# Patient Record
Sex: Female | Born: 1965 | Race: Asian | Hispanic: No | Marital: Married | Smoking: Never smoker
Health system: Southern US, Community
[De-identification: ages and names within clinical notes are randomized; demographics above are authoritative.]

## PROBLEM LIST (undated history)

## (undated) DIAGNOSIS — S92909A Unspecified fracture of unspecified foot, initial encounter for closed fracture: Secondary | ICD-10-CM

## (undated) DIAGNOSIS — E559 Vitamin D deficiency, unspecified: Secondary | ICD-10-CM

## (undated) HISTORY — DX: Vitamin D deficiency, unspecified: E55.9

## (undated) HISTORY — DX: Unspecified fracture of unspecified foot, initial encounter for closed fracture: S92.909A

---

## 1998-12-09 HISTORY — PX: ABDOMINAL HYSTERECTOMY: SHX81

## 1999-10-23 ENCOUNTER — Encounter: Payer: Self-pay | Admitting: *Deleted

## 1999-10-23 ENCOUNTER — Ambulatory Visit (HOSPITAL_COMMUNITY): Admission: RE | Admit: 1999-10-23 | Discharge: 1999-10-23 | Payer: Self-pay | Admitting: *Deleted

## 2005-07-16 ENCOUNTER — Other Ambulatory Visit: Admission: RE | Admit: 2005-07-16 | Discharge: 2005-07-16 | Payer: Self-pay | Admitting: Family Medicine

## 2008-08-31 ENCOUNTER — Ambulatory Visit: Payer: Self-pay | Admitting: Gynecology

## 2008-08-31 ENCOUNTER — Other Ambulatory Visit: Admission: RE | Admit: 2008-08-31 | Discharge: 2008-08-31 | Payer: Self-pay | Admitting: Gynecology

## 2008-08-31 ENCOUNTER — Encounter: Payer: Self-pay | Admitting: Gynecology

## 2008-09-30 ENCOUNTER — Ambulatory Visit: Payer: Self-pay | Admitting: Gynecology

## 2009-02-08 ENCOUNTER — Ambulatory Visit: Payer: Self-pay | Admitting: Vascular Surgery

## 2009-05-12 ENCOUNTER — Ambulatory Visit: Payer: Self-pay | Admitting: Vascular Surgery

## 2009-06-21 ENCOUNTER — Ambulatory Visit: Payer: Self-pay | Admitting: Vascular Surgery

## 2009-06-28 ENCOUNTER — Ambulatory Visit: Payer: Self-pay | Admitting: Vascular Surgery

## 2009-07-12 ENCOUNTER — Ambulatory Visit: Payer: Self-pay | Admitting: Vascular Surgery

## 2009-07-19 ENCOUNTER — Ambulatory Visit: Payer: Self-pay | Admitting: Vascular Surgery

## 2009-08-24 ENCOUNTER — Ambulatory Visit: Payer: Self-pay | Admitting: Gynecology

## 2009-09-01 ENCOUNTER — Ambulatory Visit: Payer: Self-pay | Admitting: Vascular Surgery

## 2009-10-05 ENCOUNTER — Encounter: Payer: Self-pay | Admitting: Gynecology

## 2009-10-05 ENCOUNTER — Ambulatory Visit: Payer: Self-pay | Admitting: Gynecology

## 2009-10-05 ENCOUNTER — Other Ambulatory Visit: Admission: RE | Admit: 2009-10-05 | Discharge: 2009-10-05 | Payer: Self-pay | Admitting: Gynecology

## 2011-03-13 ENCOUNTER — Other Ambulatory Visit: Payer: Self-pay | Admitting: Gynecology

## 2011-03-13 ENCOUNTER — Encounter (INDEPENDENT_AMBULATORY_CARE_PROVIDER_SITE_OTHER): Payer: BC Managed Care – PPO | Admitting: Gynecology

## 2011-03-13 ENCOUNTER — Other Ambulatory Visit (HOSPITAL_COMMUNITY)
Admission: RE | Admit: 2011-03-13 | Discharge: 2011-03-13 | Disposition: A | Discharge status: Home / Self Care | Payer: BC Managed Care – PPO | Source: Ambulatory Visit | Attending: Gynecology | Admitting: Gynecology

## 2011-03-13 DIAGNOSIS — Z833 Family history of diabetes mellitus: Secondary | ICD-10-CM

## 2011-03-13 DIAGNOSIS — Z13220 Encounter for screening for lipoid disorders: Secondary | ICD-10-CM

## 2011-03-13 DIAGNOSIS — R635 Abnormal weight gain: Secondary | ICD-10-CM

## 2011-03-13 DIAGNOSIS — Z01419 Encounter for gynecological examination (general) (routine) without abnormal findings: Secondary | ICD-10-CM

## 2011-03-13 DIAGNOSIS — Z124 Encounter for screening for malignant neoplasm of cervix: Secondary | ICD-10-CM | POA: Insufficient documentation

## 2011-03-14 ENCOUNTER — Other Ambulatory Visit (INDEPENDENT_AMBULATORY_CARE_PROVIDER_SITE_OTHER): Payer: BC Managed Care – PPO

## 2011-03-14 DIAGNOSIS — E039 Hypothyroidism, unspecified: Secondary | ICD-10-CM

## 2011-04-10 ENCOUNTER — Other Ambulatory Visit (INDEPENDENT_AMBULATORY_CARE_PROVIDER_SITE_OTHER): Payer: BC Managed Care – PPO

## 2011-04-10 ENCOUNTER — Encounter (INDEPENDENT_AMBULATORY_CARE_PROVIDER_SITE_OTHER): Payer: BC Managed Care – PPO | Admitting: Vascular Surgery

## 2011-04-10 DIAGNOSIS — I771 Stricture of artery: Secondary | ICD-10-CM

## 2011-04-10 DIAGNOSIS — I6529 Occlusion and stenosis of unspecified carotid artery: Secondary | ICD-10-CM

## 2011-04-11 NOTE — Assessment & Plan Note (Signed)
OFFICE VISIT  KATELAND, LEISINGER SUN DOB:  [birth year]                                       04/10/2011 ZOXWR#:[id number]  Patient presents today for evaluation of an abnormal screening carotid study.  She is well-known to me from prior treatment of her lower extremity venous varicosities.  She had undergone an outpatient screening and was found to have intimal thickening of her carotids.  She is seen today for further evaluation and formal left carotid duplex study.  PAST MEDICAL HISTORY:  Negative for any premature atherosclerosis.  FAMILY HISTORY:  Negative for premature atherosclerotic disease as well.  She denies prior amaurosis fugax or ischemic attack or stroke.  She is otherwise extremely healthy with no major medical difficulties..  PHYSICAL EXAMINATION:  A well-developed and well-nourished white female in no acute distress, appearing stated age.  Blood pressure is 110/72, pulse 53, respirations 18.  HEENT is normal.  Her carotid arteries are without bruits bilaterally.  She has normal carotid pulses.  She has 2+ radial pulses bilaterally.  She did have a carotid duplex in our office which I have ordered and independently reviewed.  This shows completely normal carotid arteries. There is no evidence of any atherosclerotic changes and wide patency.  I have reassured patient regarding this and would not recommend any further follow-up or evaluation.  She will see [place] again on an as-needed basis.    Larina Earthly, M.D. Electronically Signed  TFE/MEDQ  D:  04/10/2011  T:  04/11/2011  Job:  [id number]  cc:   Gaetano Hawthorne. Lily Peer, M.D.

## 2011-04-18 NOTE — Procedures (Unsigned)
CAROTID DUPLEX EXAM  INDICATION:  Abnormal carotid intimal thickness screen in 2011.  HISTORY: Diabetes:  No. Cardiac:  No. Hypertension:  No. Smoking:  No. Previous Surgery:  No. CV History:  Currently asymptomatic. Amaurosis Fugax No, Paresthesias No, Hemiparesis No.                                      RIGHT             LEFT Brachial systolic pressure:         102               100 Brachial Doppler waveforms:         Normal            Normal Vertebral direction of flow:        Antegrade         Antegrade DUPLEX VELOCITIES (cm/sec) CCA peak systolic                   94                80 ECA peak systolic                   57                44 ICA peak systolic                   64                63 ICA end diastolic                   19                25 PLAQUE MORPHOLOGY: PLAQUE AMOUNT:                      None              None PLAQUE LOCATION:  IMPRESSION:  No evidence of bilateral carotid artery disease noted.  ___________________________________________ Di Kindle. Edilia Bo, M.D.  CH/MEDQ  D:  04/11/2011  T:  04/11/2011  Job:  [id number]

## 2011-04-23 NOTE — Procedures (Signed)
LOWER EXTREMITY VENOUS REFLUX EXAM   INDICATION:  Bilateral leg varicose veins with pain and swelling.   EXAM:  Using color-flow imaging and pulse Doppler spectral analysis, the  right and left common femoral, superficial femoral, popliteal, posterior  tibial, greater and lesser saphenous veins are evaluated.  There is  evidence suggesting deep venous insufficiency in the right and left  lower extremity.   The right and left saphenofemoral junctions are not competent.  The  right and left GSV's are not competent with the caliber as described  below.   The right and left proximal short saphenous vein demonstrate competency.   GSV Diameter (used if found to be incompetent only)                                            Right    Left  Proximal Greater Saphenous Vein           0.67 cm  0.59 cm  Proximal-to-mid-thigh                     0.67 cm  0.59 cm  Mid thigh                                 0.67 cm  0.63 cm  Mid-distal thigh                          0.67 cm  0.63 cm  Distal thigh                              0.60 cm  0.65 cm  Knee                                      1.08 cm  0.59 cm   IMPRESSION:  1. Right and left greater saphenous vein reflux is identified with the      caliber ranging from on the right 1.08 cm to 1.14 cm knee to groin      and on the left 0.59 to 1.07.  2. The right and left greater saphenous vein is not aneurysmal.  3. The right and left greater saphenous vein is not tortuous.  4. The deep venous system is not competent.  5. The right and left lesser saphenous veins are competent.  6. No evidence of deep venous thrombosis noted in bilateral legs.   ___________________________________________  Larina Earthly, M.D.   MG/MEDQ  D:  02/08/2009  T:  02/08/2009  Job:  [id number]

## 2011-04-23 NOTE — Procedures (Signed)
DUPLEX DEEP VENOUS EXAM - LOWER EXTREMITY   INDICATION:  Followup left greater saphenous ablation.   HISTORY:  Edema:  No.  Trauma/Surgery:  Yes.  Pain:  Yes.  PE:  No.  Previous DVT:  No.  Anticoagulants:  Other:   DUPLEX EXAM:                CFV   SFV   PopV  PTV    GSV                R  L  R  L  R  L  R   L  R  L  Thrombosis    0  0     0     0      0     0  Spontaneous   +  +  +  +  +  +  +   +  +  0  Phasic        +  +  +  +  +  +  +   +  +  0  Augmentation  +  +  +  +  +  +  +   +     0  Compressible  +  +  +  +  +  +  +   +  +  0  Competent     +  +  +  +  +  +  +   +     0   Legend:  + - yes  o - no  p - partial  D - decreased   IMPRESSION:  1. No evidence of DVT noted in the left leg.  2. The left greater saphenous vein appears ablated from saphenofemoral      junction to the knee level.    _____________________________  Larina Earthly, M.D.   MG/MEDQ  D:  07/19/2009  T:  07/19/2009  Job:  [id number]

## 2011-04-23 NOTE — Assessment & Plan Note (Signed)
OFFICE VISIT   [name] [name], [name] [name] SUN  DOB:  [birth year]                                       09/01/2009  EAVWU#:[id number]   The patient presents today for followup of her staged bilateral laser  ablation of great saphenous vein and stab phlebectomies of multiple  tributary varicosities.  She has done quite well.  She reports no  discomfort at all.  In her right leg she does continue to have some dull  aching sensation in her left leg which is not limiting to her.  She has  well-healed stab phlebectomies bilaterally and no evidence of  postoperative complications.  She will continue her usual activities  without limitation and we will see her again on an as-needed basis.   Larina Earthly, M.D.  Electronically Signed   TFE/MEDQ  D:  09/01/2009  T:  09/02/2009  Job:  [id number]

## 2011-04-23 NOTE — Consult Note (Signed)
NEW PATIENT CONSULTATION   [name] [name] SUN  DOB:  1966-12-03                                       02/08/2009  ZOXWR#:[id number]   The patient presents today for evaluation of bilateral venous pathology.  She is a very pleasant 45 year old female with progressive changes of  aching discomfort in both lower extremities.  This has been progressive  for many years and is slightly worse on her right leg than her left leg.  She reports that this interferes with her daily activities.  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  She  reports that this is progressing.  She does have significant tributary  varicosities in her right calf which are more scattered over her left  calf.  She does report swelling with prolonged standing.  She does not  have any history of deep venous thrombosis or hemorrhage.  She has  elevated her legs when possible and does take ibuprofen 600 mg p.o.  t.i.d. for the discomfort, she has not worn compression garments.  She  does have a strong family history in her mother and sisters regarding  venous pathology.  Two sisters have had saphenous vein treatment for  reflux. Past medical histories are otherwise unremarkable.  She has no  known drug allergies.  She does not have any major cardiac or pulmonary  difficulties.  She does not smoke, drink alcohol.  Review of systems  otherwise completely negative except for discomfort in her lower  extremities.   CURRENT MEDICATIONS:  Estradiol 0.5 mg daily, apply to her arms.   PHYSICAL EXAMINATION:  The patient is a well-developed well-nourished  female appearing stated age 67.  She is grossly intact neurologically.  Her radial pulses are 2+.  She has 2+ dorsalis pedis pulses bilaterally.  Her lower extremities are noted for mild swelling in her ankles  bilaterally.  She does have significant tributary varicosities in her  right posterior calf and a slight amount of  varicosities in her left  posterior calf.  She underwent venous duplex today and this reveals  reflux throughout an enlarged great saphenous vein bilaterally.  This is  from the saphenofemoral junction distally.  She does have a maximal  diameter ranging from 0.6 to 1 cm throughout the course bilaterally.  She does have some reflux in her deep system as well.  I discussed  options with the patient.  She clearly has severe symptoms related to  her venous hypertension.  We have recommended compression garments as  her an initial attempt at treatment..  We fitted her with thigh-high 20  mmHg to 30 mmHg compression garments..  I did discuss options to include  laser ablation and stab phlebectomy for relief of her venous  hypertension should this conservative treatment fail.  We will see her  again in 3 months for further monitoring.   Larina Earthly, M.D.  Electronically Signed   TFE/MEDQ  D:  02/08/2009  T:  02/09/2009  Job:  [id number]   cc:   C. Duane Lope, M.D.

## 2011-04-23 NOTE — Assessment & Plan Note (Signed)
OFFICE VISIT   [name] [name], THREATS SUN  DOB:  07-17-1966                                       06/21/2009  IEPPI#:[id number]   The patient presents today for right leg laser ablation and stab  phlebectomy for venous hypertension.  She did quite well with the  procedure with normal discomfort and no immediate complications.  She  will see me in 1 week with duplex followup of the treated right leg and  also at that point will schedule left leg ablation.   Larina Earthly, M.D.  Electronically Signed   TFE/MEDQ  D:  06/21/2009  T:  06/22/2009  Job:  [id number]

## 2011-04-23 NOTE — Assessment & Plan Note (Signed)
OFFICE VISIT   [name] [name], [name] [name]  DOB:  21-Mar-1966                                       05/12/2009  ZOXWR#:[id number]   Patient presents today for continued discussion regarding her bilateral  venous hypertension.  I have seen her initially in March, 2010.  She is  a very pleasant 45 year old female with progressive changes of venous  hypertension bilaterally.  She has a great deal of discomfort and pain  with prolonged standing and sitting.  This negatively impacts her job.  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  She also  reports that this causes pain with house work, cooking, yard work, and  her daily activities.  She has been compliant with her compression  garments and reports these have given her no improvement in pain.  She  continues to elevate her legs when possible.   She had undergone a noninvasive vascular laboratory study in our office  the last visit.  This revealed gross reflux at her high-grade saphenous  veins bilaterally.  She does have tributary varicosities, more so on her  right calf than her left calf.   I discussed options with patient.  I told her she is an excellent  candidate for laser ablation of her great saphenous vein for reduction  of her venous hypertension.  I also recommend to have phlebectomy at the  same time or improvement in her pain and reflux.  I explained this is an  outpatient procedure in our office.  I explained that this would be kind  of a staged fashion.  She feels that the right leg is the most  symptomatic, and therefore we would proceed with a right great saphenous  vein laser ablation and stab phlebectomy followed by left leg.  We will  proceed at her convenience.   Larina Earthly, M.D.  Electronically Signed   TFE/MEDQ  D:  05/12/2009  T:  05/15/2009  Job:  [id number]   cc:   C. Duane Lope, M.D.

## 2011-04-23 NOTE — Assessment & Plan Note (Signed)
OFFICE VISIT   [name] [name], [name] [name] SUN  DOB:  July 19, 1966                                       07/12/2009  ZOXWR#:[id number]   Patient presents today for laser ablation and stab phlebectomy of her  left great saphenous vein and tributary varicosities.   This was done in our office under local anesthesia with no immediate  complications.  She did quite well with the procedure and will see me  again in 1 week.   Larina Earthly, M.D.  Electronically Signed   TFE/MEDQ  D:  07/12/2009  T:  07/12/2009  Job:  [id number]

## 2011-04-23 NOTE — Assessment & Plan Note (Signed)
OFFICE VISIT   [name] [name], [name] [name] SUN  DOB:  26-Dec-1965                                       06/28/2009  ZOXWR#:[id number]   The patient presents today 1 week followup laser ablation of her right  great saphenous vein and stab phlebectomy of multiple tributary  varicosities.  She has done quite well following the procedure and has  the usual amount of mild discomfort in her thigh and calf.  She has been  compliant with her compression garments.   She underwent repeat duplex of her right leg today and this shows  excellent result with thrombosis of her great saphenous vein throughout  the thigh and no evidence of deep venous thrombosis.  She is quite  pleased with her initial result as am I and wishes to proceed with her  staged left leg ablation.  We have scheduled this with her on August 4.   Larina Earthly, M.D.  Electronically Signed   TFE/MEDQ  D:  06/28/2009  T:  06/29/2009  Job:  [id number]   cc:   C. Duane Lope, M.D.

## 2011-04-23 NOTE — Assessment & Plan Note (Signed)
OFFICE VISIT   DELAYLA, HOFFMASTER SUN  DOB:  1966-03-10                                       07/19/2009  ZOXWR#:[id number]   The patient presents today for followup of her left laser ablation on  08/04, she had had a right leg ablation on 06/21/2009.  She has done  quite well with the procedure and has the usual amount of postoperative  discomfort.  She has mild bruising.   She underwent repeat duplex today and this shows no evidence of deep  venous thrombosis in the left leg.  She does have ablation of her great  saphenous vein from her knee to the saphenofemoral junction.  I am quite  pleased with the patient's result as is the patient.  I plan to see her  again in 6 weeks for final followup.  She will wear graduated  compression garments for 1 additional week.   Larina Earthly, M.D.  Electronically Signed   TFE/MEDQ  D:  07/19/2009  T:  07/20/2009  Job:  [id number]   cc:   C. Duane Lope, M.D.

## 2011-04-23 NOTE — Procedures (Signed)
DUPLEX DEEP VENOUS EXAM - LOWER EXTREMITY   INDICATION:  Followup evaluation status post EVLT.   HISTORY:  Edema:  No.  Trauma/Surgery:  Right greater saphenous vein EVLT and stab phlebectomy  06/21/2009.  Pain:  Right leg pain.  PE:  No.  Previous DVT:  No.  Anticoagulants:  No.  Other:   DUPLEX EXAM:                CFV   SFV   PopV  PTV    GSV                R  L  R  L  R  L  R   L  R  L  Thrombosis    0  0  0     0     0      +  Spontaneous   +  +  +     +     +      0  Phasic        +  +  +     +     +      0  Augmentation  +  +  +     +     +      0  Compressible  +  +  +     +     +      0  Competent     0  0  0     0     +      +   Legend:  + - yes  o - no  p - partial  D - decreased   IMPRESSION:  1. 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  2. An anterolateral branch of the right greater saphenous vein graft      is patent and competent.  3. No evidence of right leg DVT.  4. Right leg demonstrates deep vein incompetence from the right common      femoral vein through the superficial femoral vein and popliteal      vein.    _____________________________  Larina Earthly, M.D.   MC/MEDQ  D:  06/28/2009  T:  06/28/2009  Job:  [id number]

## 2011-10-25 ENCOUNTER — Encounter: Payer: Self-pay | Admitting: Gynecology

## 2012-03-24 ENCOUNTER — Other Ambulatory Visit: Payer: Self-pay | Admitting: Vascular Surgery

## 2012-06-18 ENCOUNTER — Ambulatory Visit (INDEPENDENT_AMBULATORY_CARE_PROVIDER_SITE_OTHER): Payer: BC Managed Care – PPO | Admitting: Gynecology

## 2012-06-18 ENCOUNTER — Encounter: Payer: Self-pay | Admitting: Gynecology

## 2012-06-18 VITALS — BP 112/70 | Ht 62.0 in | Wt 156.0 lb

## 2012-06-18 DIAGNOSIS — E7800 Pure hypercholesterolemia, unspecified: Secondary | ICD-10-CM

## 2012-06-18 DIAGNOSIS — L819 Disorder of pigmentation, unspecified: Secondary | ICD-10-CM

## 2012-06-18 DIAGNOSIS — R319 Hematuria, unspecified: Secondary | ICD-10-CM

## 2012-06-18 DIAGNOSIS — R635 Abnormal weight gain: Secondary | ICD-10-CM | POA: Insufficient documentation

## 2012-06-18 DIAGNOSIS — R946 Abnormal results of thyroid function studies: Secondary | ICD-10-CM

## 2012-06-18 DIAGNOSIS — Z01419 Encounter for gynecological examination (general) (routine) without abnormal findings: Secondary | ICD-10-CM

## 2012-06-18 DIAGNOSIS — Z780 Asymptomatic menopausal state: Secondary | ICD-10-CM | POA: Insufficient documentation

## 2012-06-18 DIAGNOSIS — R7989 Other specified abnormal findings of blood chemistry: Secondary | ICD-10-CM

## 2012-06-18 DIAGNOSIS — L811 Chloasma: Secondary | ICD-10-CM | POA: Insufficient documentation

## 2012-06-18 LAB — CBC WITH DIFFERENTIAL/PLATELET
Basophils Absolute: 0 10*3/uL (ref 0.0–0.1)
Basophils Relative: 0 % (ref 0–1)
Eosinophils Absolute: 0.5 10*3/uL (ref 0.0–0.7)
Eosinophils Relative: 7 % — ABNORMAL HIGH (ref 0–5)
HCT: 37.2 % (ref 36.0–46.0)
Hemoglobin: 12.3 g/dL (ref 12.0–15.0)
Lymphocytes Relative: 33 % (ref 12–46)
Lymphs Abs: 2.4 10*3/uL (ref 0.7–4.0)
MCH: 29.4 pg (ref 26.0–34.0)
MCHC: 33.1 g/dL (ref 30.0–36.0)
MCV: 89 fL (ref 78.0–100.0)
Monocytes Absolute: 0.3 10*3/uL (ref 0.1–1.0)
Monocytes Relative: 4 % (ref 3–12)
Neutro Abs: 4.1 10*3/uL (ref 1.7–7.7)
Neutrophils Relative %: 56 % (ref 43–77)
Platelets: 253 10*3/uL (ref 150–400)
RBC: 4.18 MIL/uL (ref 3.87–5.11)
RDW: 13.9 % (ref 11.5–15.5)
WBC: 7.3 10*3/uL (ref 4.0–10.5)

## 2012-06-18 LAB — HEMOGLOBIN A1C
Hgb A1c MFr Bld: 5.6 % (ref <5.7)
Mean Plasma Glucose: 114 mg/dL (ref <117)

## 2012-06-18 LAB — TSH: TSH: 0.295 u[IU]/mL — ABNORMAL LOW (ref 0.350–4.500)

## 2012-06-18 LAB — CHOLESTEROL, TOTAL: Cholesterol: 211 mg/dL — ABNORMAL HIGH (ref 0–200)

## 2012-06-18 NOTE — Patient Instructions (Signed)
Health Maintenance, 18- to 48-Year-Old SCHOOL PERFORMANCE After high school completion, the young adult may be attending college, technical or vocational school, or entering the military or the work force. SOCIAL AND EMOTIONAL DEVELOPMENT The young adult establishes adult relationships and explores sexual identity. Young adults may be living at home or in a college dorm or apartment. Increasing independence is important with young adults. Throughout adolescence, teens should assume responsibility of their own health care. IMMUNIZATIONS Most young adults should be fully vaccinated. A booster dose of Tdap (tetanus, diphtheria, and pertussis, or "whooping cough"), a dose of meningococcal vaccine to protect against a certain type of bacterial meningitis, hepatitis A, human papillomarvirus (HPV), chickenpox, or measles vaccines may be indicated, if not given at an earlier age. Annual influenza or "flu" vaccination should be considered during flu season.  TESTING Annual screening for vision and hearing problems is recommended. Vision should be screened objectively at least once between 18 and 48 years of age. The young adult may be screened for anemia or tuberculosis. Young adults should have a blood test to check for high cholesterol during this time period. Young adults should be screened for use of alcohol and drugs. If the young adult is sexually active, screening for sexually transmitted infections, pregnancy, or HIV may be performed. Screening for cervical cancer should be performed within 3 years of beginning sexual activity. NUTRITION AND ORAL HEALTH  Adequate calcium intake is important. Consume 3 servings of low-fat milk and dairy products daily. For those who do not drink milk or consume dairy products, calcium enriched foods, such as juice, bread, or cereal, dark, leafy greens, or canned fish are alternate sources of calcium.   Drink plenty of water. Limit fruit juice to 8 to 12 ounces per day.  Avoid sugary beverages or sodas.   Discourage skipping meals, especially breakfast. Teens should eat a good variety of vegetables and fruits, as well as lean meats.   Avoid high fat, high salt, and high sugar foods, such as candy, chips, and cookies.   Encourage young adults to participate in meal planning and preparation.   Eat meals together as a family whenever possible. Encourage conversation at mealtime.   Limit fast food choices and eating out at restaurants.   Brush teeth twice a day and floss.   Schedule dental exams twice a year.  SLEEP Regular sleep habits are important. PHYSICAL, SOCIAL, AND EMOTIONAL DEVELOPMENT  One hour of regular physical activity daily is recommended. Continue to participate in sports.   Encourage young adults to develop their own interests and consider community service or volunteerism.   Provide guidance to the young adult in making decisions about college and work plans.   Make sure that young adults know that they should never be in a situation that makes them uncomfortable, and they should tell partners if they do not want to engage in sexual activity.   Talk to the young adult about body image. Eating disorders may be noted at this time. Young adults may also be concerned about being overweight. Monitor the young adult for weight gain or loss.   Mood disturbances, depression, anxiety, alcoholism, or attention problems may be noted in young adults. Talk to the caregiver if there are concerns about mental illness.   Negotiate limit setting and independent decision making.   Encourage the young adult to handle conflict without physical violence.   Avoid loud noises which may impair hearing.   Limit television and computer time to 2 hours per day.   Individuals who engage in excessive sedentary activity are more likely to become overweight.  RISK BEHAVIORS  Sexually active young adults need to take precautions against pregnancy and sexually  transmitted infections. Talk to young adults about contraception.   Provide a tobacco-free and drug-free environment for the young adult. Talk to the young adult about drug, tobacco, and alcohol use among friends or at friends' homes. Make sure the young adult knows that smoking tobacco or marijuana and taking drugs have health consequences and may impact brain development.   Teach the young adult about appropriate use of over-the-counter or prescription medicines.   Establish guidelines for driving and for riding with friends.   Talk to young adults about the risks of drinking and driving or boating. Encourage the young adult to call you if he or she or friends have been drinking or using drugs.   Remind young adults to wear seat belts at all times in cars and life vests in boats.   Young adults should always wear a properly fitted helmet when they are riding a bicycle.   Use caution with all-terrain vehicles (ATVs) or other motorized vehicles.   Do not keep handguns in the home. (If you do, the gun and ammunition should be locked separately and out of the young adult's access.)   Equip your home with smoke detectors and change the batteries regularly. Make sure all family members know the fire escape plans for your home.   Teach young adults not to swim alone and not to dive in shallow water.   All individuals should wear sunscreen that protects against UVA and UVB light with at least a sun protection factor (SPF) of 30 when out in the sun. This minimizes sun burning.  WHAT'S NEXT? Young adults should visit their pediatrician or family physician yearly. By young adulthood, health care should be transitioned to a family physician or internal medicine specialist. Sexually active females may want to begin annual physical exams with a gynecologist. Document Released: 02/20/2007 Document Revised: 11/14/2011 Document Reviewed: 03/12/2007 [employer] Patient Information 2012 [employer]. 

## 2012-06-18 NOTE — Progress Notes (Signed)
[name] [name] 20-Jul-1966 [id number]   History:    46 y.o.  for annual gyn exam with complaint of skin color changes as a result of her hormones. Patient has a history of a total abdominal hysterectomy with bilateral salpingo-oophorectomy in the year 2000 as a result of endometriosis. She had been on hormone replacement therapy since that time. She has been on numerous regimens from oral to transdermal. Review of her record indicated her last bone density study was in 2009. She also been concerned about weight gain. Her last mammogram was in 2012 Past medical history,surgical history, family history and social history were all reviewed and documented in the EPIC chart.  Gynecologic History No LMP recorded. Patient has had a hysterectomy. Contraception: Prior hysterectomy Last Pap: 2012. Results were: normal Last mammogram: 2012. Results were: normal  Obstetric History OB History    Grav Para Term Preterm Abortions TAB SAB Ect Mult Living   1 1 1       1      # Outc Date GA Lbr Len/2nd Wgt Sex Del Anes PTL Lv   1 TRM     F CS  No Yes       ROS: A ROS was performed and pertinent positives and negatives are included in the history.  GENERAL: No fevers or chills. HEENT: No change in vision, no earache, sore throat or sinus congestion. NECK: No pain or stiffness. CARDIOVASCULAR: No chest pain or pressure. No palpitations. PULMONARY: No shortness of breath, cough or wheeze. GASTROINTESTINAL: No abdominal pain, nausea, vomiting or diarrhea, melena or bright red blood per rectum. GENITOURINARY: No urinary frequency, urgency, hesitancy or dysuria. MUSCULOSKELETAL: No joint or muscle pain, no back pain, no recent trauma. DERMATOLOGIC: No rash, no itching, no lesions. ENDOCRINE: No polyuria, polydipsia, no heat or cold intolerance. No recent change in weight. HEMATOLOGICAL: No anemia or easy bruising or bleeding. NEUROLOGIC: No headache, seizures, numbness, tingling or weakness. PSYCHIATRIC: No depression,  no loss of interest in normal activity or change in sleep pattern.     Exam: chaperone present  BP 112/70  Ht 5\' 2"  (1.575 m)  Wt 156 lb (70.761 kg)  BMI 28.53 kg/m2  Body mass index is 28.53 kg/(m^2).  General appearance : Well developed well nourished female. No acute distress HEENT: Neck supple, trachea midline, no carotid bruits, no thyroidmegaly Lungs: Clear to auscultation, no rhonchi or wheezes, or rib retractions  Heart: Regular rate and rhythm, no murmurs or gallops Breast:Examined in sitting and supine position were symmetrical in appearance, no palpable masses or tenderness,  no skin retraction, no nipple inversion, no nipple discharge, no skin discoloration, no axillary or supraclavicular lymphadenopathy Abdomen: no palpable masses or tenderness, no rebound or guarding Extremities: no edema or skin discoloration or tenderness  Pelvic:  Bartholin, Urethra, Skene Glands: Within normal limits             Vagina: No gross lesions or discharge  Cervix: Absent Uterus absent  Adnexa  Without masses or tenderness  Anus and perineum  normal   Rectovaginal  normal sphincter tone without palpated masses or tenderness             Hemoccult not done     Assessment/Plan:  46 y.o. female for annual exam who wanted to change her hormone treatment. We had a lengthy discussion of the women's health initiative study. She has now been on hormone replacement therapy for almost 13 years. I would like to offer her an alternative with a  nonhormonal product:Brisdelle 7.5 mg daily (Paxil) SSRI. I've encouraged also to exercise regularly as well as to add to her diet slowly-containing products. The following labs will be drawn today: CBC, TSH, hemoglobin A1c, along with a urinalysis and screening cholesterol. No Pap smear done today. New Pap smear screening guidelines discussed. She was reminded to perform her monthly self breast examination. We will monitor her symptoms. All questions are answered  and we'll follow accordingly. She will schedule her bone density study in the next couple weeks here in the office.    Ok Edwards MD, 2:06 PM 06/18/2012

## 2012-06-19 ENCOUNTER — Encounter: Payer: BC Managed Care – PPO | Admitting: Gynecology

## 2012-06-19 LAB — URINALYSIS W MICROSCOPIC + REFLEX CULTURE
Bacteria, UA: NONE SEEN
Bilirubin Urine: NEGATIVE
Casts: NONE SEEN
Crystals: NONE SEEN
Glucose, UA: NEGATIVE mg/dL
Ketones, ur: NEGATIVE mg/dL
Nitrite: NEGATIVE
Specific Gravity, Urine: <1.005 — ABNORMAL LOW (ref 1.005–1.030)
pH: 7 (ref 5.0–8.0)

## 2012-06-19 NOTE — Addendum Note (Signed)
Addended by: Venora Maples on: 06/19/2012 11:06 AM   Modules accepted: Orders

## 2012-06-24 ENCOUNTER — Other Ambulatory Visit: Payer: BC Managed Care – PPO

## 2012-06-24 DIAGNOSIS — R7989 Other specified abnormal findings of blood chemistry: Secondary | ICD-10-CM

## 2012-06-24 DIAGNOSIS — Z13820 Encounter for screening for osteoporosis: Secondary | ICD-10-CM

## 2012-06-24 DIAGNOSIS — E7800 Pure hypercholesterolemia, unspecified: Secondary | ICD-10-CM

## 2012-06-24 DIAGNOSIS — Z780 Asymptomatic menopausal state: Secondary | ICD-10-CM

## 2012-06-24 LAB — LIPID PANEL: Cholesterol: 216 mg/dL — ABNORMAL HIGH (ref 0–200)

## 2012-06-26 ENCOUNTER — Other Ambulatory Visit: Payer: Self-pay | Admitting: Gynecology

## 2012-06-26 DIAGNOSIS — E7800 Pure hypercholesterolemia, unspecified: Secondary | ICD-10-CM

## 2012-07-14 ENCOUNTER — Ambulatory Visit (INDEPENDENT_AMBULATORY_CARE_PROVIDER_SITE_OTHER): Payer: BC Managed Care – PPO

## 2012-07-14 DIAGNOSIS — Z13820 Encounter for screening for osteoporosis: Secondary | ICD-10-CM

## 2012-07-22 ENCOUNTER — Encounter: Payer: Self-pay | Admitting: Gynecology

## 2012-09-07 ENCOUNTER — Telehealth: Payer: Self-pay | Admitting: *Deleted

## 2012-09-07 NOTE — Telephone Encounter (Signed)
You can call her generic Paxil 10 mg and have her take half a tablet daily. Prescribe 30 refill x5.

## 2012-09-07 NOTE — Telephone Encounter (Signed)
Pharmacist Debbie called and states that the Rx you wrote for the patient in July that she is just now taking to them is not available yet. Brisdelle (paxil) 7.5mg . Do you want to give Paxil 10mg ? Or Pls advise. KW

## 2012-09-08 MED ORDER — PAROXETINE HCL 10 MG PO TABS: 10.0000 mg | ORAL_TABLET | ORAL | Status: DC

## 2012-09-08 NOTE — Telephone Encounter (Signed)
Informed pharmacy KW

## 2012-09-08 NOTE — Addendum Note (Signed)
Addended by: Richardson Chiquito on: 09/08/2012 10:58 AM   Modules accepted: Orders

## 2013-02-10 ENCOUNTER — Encounter: Payer: Self-pay | Admitting: Gynecology

## 2013-06-21 ENCOUNTER — Ambulatory Visit (INDEPENDENT_AMBULATORY_CARE_PROVIDER_SITE_OTHER): Payer: BC Managed Care – PPO | Admitting: Gynecology

## 2013-06-21 ENCOUNTER — Encounter: Payer: Self-pay | Admitting: Gynecology

## 2013-06-21 VITALS — BP 110/72 | Ht 62.25 in | Wt 144.5 lb

## 2013-06-21 DIAGNOSIS — Z23 Encounter for immunization: Secondary | ICD-10-CM

## 2013-06-21 DIAGNOSIS — Z1159 Encounter for screening for other viral diseases: Secondary | ICD-10-CM

## 2013-06-21 DIAGNOSIS — R634 Abnormal weight loss: Secondary | ICD-10-CM

## 2013-06-21 DIAGNOSIS — N951 Menopausal and female climacteric states: Secondary | ICD-10-CM

## 2013-06-21 DIAGNOSIS — N952 Postmenopausal atrophic vaginitis: Secondary | ICD-10-CM

## 2013-06-21 DIAGNOSIS — Z01419 Encounter for gynecological examination (general) (routine) without abnormal findings: Secondary | ICD-10-CM

## 2013-06-21 DIAGNOSIS — Z780 Asymptomatic menopausal state: Secondary | ICD-10-CM

## 2013-06-21 MED ORDER — MEDROXYPROGESTERONE ACETATE 150 MG/ML IM SUSP: 150.0000 mg | Freq: Once | INTRAMUSCULAR | Status: DC

## 2013-06-21 MED ORDER — ESTRADIOL 10 MCG VA TABS: 1.0000 | ORAL_TABLET | VAGINAL | Status: DC

## 2013-06-21 NOTE — Addendum Note (Signed)
Addended by: Bertram Savin A on: 06/21/2013 05:00 PM   Modules accepted: Orders

## 2013-06-21 NOTE — Progress Notes (Signed)
[name] [name] [birth year] [id number]   History:    47 y.o.  for annual gyn exam complaining of mild vasomotor symptoms. Patient has been tried in the past on multiple sutures of oral and transdermal estrogen. Patient is currently taking soy products and exercising which has curtailed her symptoms. She does have vaginal atrophy and decreased libido at times. Patient with past history of total abdominal hysterectomy with bilateral salpingo-oophorectomy in the year 2000 as a result of severe endometriosis. She had been on hormone replacement therapy until 2013. Patient had a normal bone density study in 2009 and 2013. She is taking calcium and vitamin D and exercises regularly.  Past medical history,surgical history, family history and social history were all reviewed and documented in the EPIC chart.  Gynecologic History No LMP recorded. Patient has had a hysterectomy. Contraception: status post hysterectomy Last Pap: 2012. Results were: normal Last mammogram: 2014. Results were: normal  Obstetric History OB History   Grav Para Term Preterm Abortions TAB SAB Ect Mult Living   1 1 1       1      # Outc Date GA Lbr Len/2nd Wgt Sex Del Anes PTL Lv   1 TRM     F CS  No Yes       ROS: A ROS was performed and pertinent positives and negatives are included in the history.  GENERAL: No fevers or chills. HEENT: No change in vision, no earache, sore throat or sinus congestion. NECK: No pain or stiffness. CARDIOVASCULAR: No chest pain or pressure. No palpitations. PULMONARY: No shortness of breath, cough or wheeze. GASTROINTESTINAL: No abdominal pain, nausea, vomiting or diarrhea, melena or bright red blood per rectum. GENITOURINARY: No urinary frequency, urgency, hesitancy or dysuria. MUSCULOSKELETAL: No joint or muscle pain, no back pain, no recent trauma. DERMATOLOGIC: No rash, no itching, no lesions. ENDOCRINE: No polyuria, polydipsia, no heat or cold intolerance. No recent change in weight.  HEMATOLOGICAL: No anemia or easy bruising or bleeding. NEUROLOGIC: No headache, seizures, numbness, tingling or weakness. PSYCHIATRIC: No depression, no loss of interest in normal activity or change in sleep pattern.     Exam: chaperone present  BP 110/72  Ht 5' 2.25" (1.581 m)  Wt 144 lb 8 oz (65.545 kg)  BMI 26.22 kg/m2  Body mass index is 26.22 kg/(m^2).  General appearance : Well developed well nourished female. No acute distress HEENT: Neck supple, trachea midline, no carotid bruits, no thyroidmegaly Lungs: Clear to auscultation, no rhonchi or wheezes, or rib retractions  Heart: Regular rate and rhythm, no murmurs or gallops Breast:Examined in sitting and supine position were symmetrical in appearance, no palpable masses or tenderness,  no skin retraction, no nipple inversion, no nipple discharge, no skin discoloration, no axillary or supraclavicular lymphadenopathy Abdomen: no palpable masses or tenderness, no rebound or guarding Extremities: no edema or skin discoloration or tenderness  Pelvic:  Bartholin, Urethra, Skene Glands: Within normal limits             Vagina: No gross lesions or discharge  Cervix: absent  Uterus Absent  Adnexa  Without masses or tenderness  Anus and perineum  normal   Rectovaginal  normal sphincter tone without palpated masses or tenderness             Hemoccult None indicated     Assessment/Plan:  47 y.o. female for annual exam  Who in the year 2000 had TAH/BSO as a result of severe endometriosis. Patient has been off hormone replacement therapy  for over a year and a suffering from vaginal atrophy and dyspareunia and slightly decreased sex drive and some vasomotor symptoms. She will be placed on Vagifem 10 mcg intravaginally twice a week and we'll monitor her symptoms. The risks benefits pros and cons were discussed as well as the women's health initiative study. She'll return back in the fasting state to get her blood work which will consist of the  following: Fasting lipid profile, TSH, comprehensive metabolic panel, as well as urinalysis and CBC. No Pap smear done today new guidelines were discussed.  New CDC guidelines is recommending patients be tested once in her lifetime for hepatitis C antibody who were born between [birth year] through 1967. This was discussed with the patient today and has agreed to be tested today.She will need a bone density study next year. She has lost 12 pounds his last year since she has been exercising and eating healthier.  Patient was counseled and received the Tdap vaccine today.    Ok Edwards MD, 4:48 PM 06/21/2013

## 2013-06-21 NOTE — Patient Instructions (Addendum)
Breast Self-Awareness Practicing breast self-awareness may pick up problems early, prevent significant medical complications, and possibly save your life. By practicing breast self-awareness, you can become familiar with how your breasts look and feel and if your breasts are changing. This allows you to notice changes early. It can also offer you some reassurance that your breast health is good. One way to learn what is normal for your breasts and whether your breasts are changing is to do a breast self-exam. If you find a lump or something that was not present in the past, it is best to contact your caregiver right away. Other findings that should be evaluated by your caregiver include nipple discharge, especially if it is bloody; skin changes or reddening; areas where the skin seems to be pulled in (retracted); or new lumps and bumps. Breast pain is seldom associated with cancer (malignancy), but should also be evaluated by a caregiver. HOW TO PERFORM A BREAST SELF-EXAM The best time to examine your breasts is 5 7 days after your menstrual period is over. During menstruation, the breasts are lumpier, and it may be more difficult to pick up changes. If you do not menstruate, have reached menopause, or had your uterus removed (hysterectomy), you should examine your breasts at regular intervals, such as monthly. If you are breastfeeding, examine your breasts after a feeding or after using a breast pump. Breast implants do not decrease the risk for lumps or tumors, so continue to perform breast self-exams as recommended. Talk to your caregiver about how to determine the difference between the implant and breast tissue. Also, talk about the amount of pressure you should use during the exam. Over time, you will become more familiar with the variations of your breasts and more comfortable with the exam. A breast self-exam requires you to remove all your clothes above the waist. 1. Look at your breasts and nipples.  Stand in front of a mirror in a room with good lighting. With your hands on your hips, push your hands firmly downward. Look for a difference in shape, contour, and size from one breast to the other (asymmetry). Asymmetry includes puckers, dips, or bumps. Also, look for skin changes, such as reddened or scaly areas on the breasts. Look for nipple changes, such as discharge, dimpling, repositioning, or redness. 2. Carefully feel your breasts. This is best done either in the shower or tub while using soapy water or when flat on your back. Place the arm (on the side of the breast you are examining) above your head. Use the pads (not the fingertips) of your three middle fingers on your opposite hand to feel your breasts. Start in the underarm area and use  inch (2 cm) overlapping circles to feel your breast. Use 3 different levels of pressure (light, medium, and firm pressure) at each circle before moving to the next circle. The light pressure is needed to feel the tissue closest to the skin. The medium pressure will help to feel breast tissue a little deeper, while the firm pressure is needed to feel the tissue close to the ribs. Continue the overlapping circles, moving downward over the breast until you feel your ribs below your breast. Then, move one finger-width towards the center of the body. Continue to use the  inch (2 cm) overlapping circles to feel your breast as you move slowly up toward the collar bone (clavicle) near the base of the neck. Continue the up and down exam using all 3 pressures until you reach   the middle of the chest. Do this with each breast, carefully feeling for lumps or changes. 3.  Keep a written record with breast changes or normal findings for each breast. By writing this information down, you do not need to depend only on memory for size, tenderness, or location. Write down where you are in your menstrual cycle, if you are still menstruating. Breast tissue can have some lumps or  thick tissue. However, see your caregiver if you find anything that concerns you.  SEEK MEDICAL CARE IF:  You see a change in shape, contour, or size of your breasts or nipples.   You see skin changes, such as reddened or scaly areas on the breasts or nipples.   You have an unusual discharge from your nipples.   You feel a new lump or unusually thick areas.  Document Released: 11/25/2005 Document Revised: 11/11/2012 Document Reviewed: 03/11/2012 [facility] Patient Information 2014 [place], [place]. Hormone Therapy At menopause, your body begins making less estrogen and progesterone hormones. This causes the body to stop having menstrual periods. This is because estrogen and progesterone hormones control your periods and menstrual cycle. A lack of estrogen may cause symptoms such as:  Hot flushes (or hot flashes).  Vaginal dryness.  Dry skin.  Loss of sex drive.  Risk of bone loss (osteoporosis). When this happens, you may choose to take hormone therapy to get back the estrogen lost during menopause. When the hormone estrogen is given alone, it is usually referred to as ET (Estrogen Therapy). When the hormone progestin is combined with estrogen, it is generally called HT (Hormone Therapy). This was formerly known as hormone replacement therapy (HRT). Your caregiver can help you make a decision on what will be best for you. The decision to use HT seems to change often as new studies are done. Many studies do not agree on the benefits of hormone replacement therapy. LIKELY BENEFITS OF HT INCLUDE PROTECTION FROM:  Hot Flushes (also called hot flashes) - A hot flush is a sudden feeling of heat that spreads over the face and body. The skin may redden like a blush. It is connected with sweats and sleep disturbance. Women going through menopause may have hot flushes a few times a month or several times per day depending on the woman.  Osteoporosis (bone loss)- Estrogen helps guard against bone  loss. After menopause, a woman's bones slowly lose calcium and become weak and brittle. As a result, bones are more likely to break. The hip, wrist, and spine are affected most often. Hormone therapy can help slow bone loss after menopause. Weight bearing exercise and taking calcium with vitamin D also can help prevent bone loss. There are also medications that your caregiver can prescribe that can help prevent osteoporosis.  Vaginal Dryness - Loss of estrogen causes changes in the vagina. Its lining may become thin and dry. These changes can cause pain and bleeding during sexual intercourse. Dryness can also lead to infections. This can cause burning and itching. (Vaginal estrogen treatment can help relieve pain, itching, and dryness.)  Urinary Tract Infections are more common after menopause because of lack of estrogen. Some women also develop urinary incontinence because of low estrogen levels in the vagina and bladder.  Possible other benefits of estrogen include a positive effect on mood and short-term memory in women. RISKS AND COMPLICATIONS  Using estrogen alone without progesterone causes the lining of the uterus to grow. This increases the risk of lining of the uterus (endometrial) cancer. Your  caregiver should give another hormone called progestin if you have a uterus.  Women who take combined (estrogen and progestin) HT appear to have an increased risk of breast cancer. The risk appears to be small, but increases throughout the time that HT is taken.  Combined therapy also makes the breast tissue slightly denser which makes it harder to read mammograms (breast X-rays).  Combined, estrogen and progesterone therapy can be taken together every day, in which case there may be spotting of blood. HT therapy can be taken cyclically in which case you will have menstrual periods. Cyclically means HT is taken for a set amount of days, then not taken, then this process is repeated.  HT may increase  the risk of stroke, heart attack, breast cancer and forming blood clots in your leg.  Transdermal estrogen (estrogen that is absorbed through the skin with a patch or a cream) may have more positive results with:  Cholesterol.  Blood pressure.  Blood clots. Having the following conditions may indicate you should not have HT:  Endometrial cancer.  Liver disease.  Breast cancer.  Heart disease.  History of blood clots.  Stroke. TREATMENT   If you choose to take HT and have a uterus, usually estrogen and progestin are prescribed.  Your caregiver will help you decide the best way to take the medications.  Possible ways to take estrogen include:  Pills.  Patches.  Gels.  Sprays.  Vaginal estrogen cream, rings and tablets.  It is best to take the lowest dose possible that will help your symptoms and take them for the shortest period of time that you can.  Hormone therapy can help relieve some of the problems (symptoms) that affect women at menopause. Before making a decision about HT, talk to your caregiver about what is best for you. Be well informed and comfortable with your decisions. HOME CARE INSTRUCTIONS   Follow your caregivers advice when taking the medications.  A Pap test is done to screen for cervical cancer.  The first Pap test should be done at age 51.  Between ages 49 and 21, Pap tests are repeated every 2 years.  Beginning at age 1, you are advised to have a Pap test every 3 years as long as your past 3 Pap tests have been normal.  Some women have medical problems that increase the chance of getting cervical cancer. Talk to your caregiver about these problems. It is especially important to talk to your caregiver if a new problem develops soon after your last Pap test. In these cases, your caregiver may recommend more frequent screening and Pap tests.  The above recommendations are the same for women who have or have not gotten the vaccine for HPV  (Human Papillomavirus).  If you had a hysterectomy for a problem that was not a cancer or a condition that could lead to cancer, then you no longer need Pap tests. However, even if you no longer need a Pap test, a regular exam is a good idea to make sure no other problems are starting.   If you are between ages 66 and 78, and you have had normal Pap tests going back 10 years, you no longer need Pap tests. However, even if you no longer need a Pap test, a regular exam is a good idea to make sure no other problems are starting.   If you have had past treatment for cervical cancer or a condition that could lead to cancer, you need Pap tests  and screening for cancer for at least 20 years after your treatment.  If Pap tests have been discontinued, risk factors (such as a new sexual partner) need to be re-assessed to determine if screening should be resumed.  Some women may need screenings more often if they are at high risk for cervical cancer.  Get mammograms done as per the advice of your caregiver. SEEK IMMEDIATE MEDICAL CARE IF:  You develop abnormal vaginal bleeding.  You have pain or swelling in your legs, shortness of breath, or chest pain.  You develop dizziness or headaches.  You have lumps or changes in your breasts or armpits.  You have slurred speech.  You develop weakness or numbness of your arms or legs. You have pain, burning, or bleeding when urinating.   Tetanus, Diphtheria, Pertussis (Tdap) Vaccine What You Need to Know WHY GET VACCINATED? Tetanus, diphtheria and pertussis can be very serious diseases, even for adolescents and adults. Tdap vaccine can protect [place] from these diseases. TETANUS (Lockjaw) causes painful muscle tightening and stiffness, usually all over the body.  It can lead to tightening of muscles in the head and neck so you can't open your mouth, swallow, or sometimes even breathe. Tetanus kills about 1 out of 5 people who are infected. DIPHTHERIA  can cause a thick coating to form in the back of the throat.  It can lead to breathing problems, paralysis, heart failure, and death. PERTUSSIS (Whooping Cough) causes severe coughing spells, which can cause difficulty breathing, vomiting and disturbed sleep.  It can also lead to weight loss, incontinence, and rib fractures. Up to 2 in 100 adolescents and 5 in 100 adults with pertussis are hospitalized or have complications, which could include pneumonia and death. These diseases are caused by bacteria. Diphtheria and pertussis are spread from person to person through coughing or sneezing. Tetanus enters the body through cuts, scratches, or wounds. Before vaccines, the [place] saw as many as 200,000 cases a year of diphtheria and pertussis, and hundreds of cases of tetanus. Since vaccination began, tetanus and diphtheria have dropped by about 99% and pertussis by about 80%. TDAP VACCINE Tdap vaccine can protect adolescents and adults from tetanus, diphtheria, and pertussis. One dose of Tdap is routinely given at age 11 or 33. People who did not get Tdap at that age should get it as soon as possible. Tdap is especially important for health care professionals and anyone having close contact with a baby younger than 12 months. Pregnant women should get a dose of Tdap during every pregnancy, to protect the newborn from pertussis. Infants are most at risk for severe, life-threatening complications from pertussis. A similar vaccine, called Td, protects from tetanus and diphtheria, but not pertussis. A Td booster should be given every 10 years. Tdap may be given as one of these boosters if you have not already gotten a dose. Tdap may also be given after a severe cut or burn to prevent tetanus infection. Your doctor can give you more information. Tdap may safely be given at the same time as other vaccines. SOME PEOPLE SHOULD NOT GET THIS VACCINE  If you ever had a life-threatening allergic reaction  after a dose of any tetanus, diphtheria, or pertussis containing vaccine, OR if you have a severe allergy to any part of this vaccine, you should not get Tdap. Tell your doctor if you have any severe allergies.  If you had a coma, or long or multiple seizures within 7 days after a childhood dose  of DTP or DTaP, you should not get Tdap, unless a cause other than the vaccine was found. You can still get Td.  Talk to your doctor if you:  have epilepsy or another nervous system problem,  had severe pain or swelling after any vaccine containing diphtheria, tetanus or pertussis,  ever had Guillain-Barr Syndrome (GBS),  aren't feeling well on the day the shot is scheduled. RISKS OF A VACCINE REACTION With any medicine, including vaccines, there is a chance of side effects. These are usually mild and go away on their own, but serious reactions are also possible. Brief fainting spells can follow a vaccination, leading to injuries from falling. Sitting or lying down for about 15 minutes can help prevent these. Tell your doctor if you feel dizzy or light-headed, or have vision changes or ringing in the ears. Mild problems following Tdap (Did not interfere with activities)  Pain where the shot was given (about 3 in 4 adolescents or 2 in 3 adults)  Redness or swelling where the shot was given (about 1 person in 5)  Mild fever of at least 100.26F (up to about 1 in 25 adolescents or 1 in 100 adults)  Headache (about 3 or 4 people in 10)  Tiredness (about 1 person in 3 or 4)  Nausea, vomiting, diarrhea, stomach ache (up to 1 in 4 adolescents or 1 in 10 adults)  Chills, body aches, sore joints, rash, swollen glands (uncommon) Moderate problems following Tdap (Interfered with activities, but did not require medical attention)  Pain where the shot was given (about 1 in 5 adolescents or 1 in 100 adults)  Redness or swelling where the shot was given (up to about 1 in 16 adolescents or 1 in 25  adults)  Fever over 102F (about 1 in 100 adolescents or 1 in 250 adults)  Headache (about 3 in 20 adolescents or 1 in 10 adults)  Nausea, vomiting, diarrhea, stomach ache (up to 1 or 3 people in 100)  Swelling of the entire arm where the shot was given (up to about 3 in 100). Severe problems following Tdap (Unable to perform usual activities, required medical attention)  Swelling, severe pain, bleeding and redness in the arm where the shot was given (rare). A severe allergic reaction could occur after any vaccine (estimated less than 1 in a million doses). WHAT IF THERE IS A SERIOUS REACTION? What should I look for?  Look for anything that concerns you, such as signs of a severe allergic reaction, very high fever, or behavior changes. Signs of a severe allergic reaction can include hives, swelling of the face and throat, difficulty breathing, a fast heartbeat, dizziness, and weakness. These would start a few minutes to a few hours after the vaccination. What should I do?  If you think it is a severe allergic reaction or other emergency that can't wait, call 9-1-1 or get the person to the nearest hospital. Otherwise, call your doctor.  Afterward, the reaction should be reported to the "Vaccine Adverse Event Reporting System" (VAERS). Your doctor might file this report, or you can do it yourself through the VAERS web site at www.vaers.LAgents.no, or by calling 1-(709)830-7790. VAERS is only for reporting reactions. They do not give medical advice.  THE NATIONAL VACCINE INJURY COMPENSATION PROGRAM The National Vaccine Injury Compensation Program (VICP) is a federal program that was created to compensate people who may have been injured by certain vaccines. Persons who believe they may have been injured by a vaccine can learn  about the program and about filing a claim by calling 1-346-878-8477 or visiting the VICP website at SpiritualWord.at. HOW CAN I LEARN MORE?  Ask your  doctor.  Call your local or state health department.  Contact the Centers for Disease Control and Prevention (CDC):  Call 818 297 3624 or visit CDC's website at PicCapture.uy. CDC Tdap Vaccine VIS (04/16/12) Document Released: 05/26/2012 Document Revised: 08/19/2012 Document Reviewed: 05/26/2012 [employer] Patient Information 2014 [place], [place].    You develop abdominal pain. Document Released: 08/24/2003 Document Revised: 02/17/2012 Document Reviewed: 12/12/2010 [facility] Patient Information 2014 [place], [place].

## 2013-06-24 ENCOUNTER — Ambulatory Visit: Payer: BC Managed Care – PPO

## 2013-06-24 DIAGNOSIS — Z1159 Encounter for screening for other viral diseases: Secondary | ICD-10-CM

## 2013-06-24 DIAGNOSIS — E7800 Pure hypercholesterolemia, unspecified: Secondary | ICD-10-CM

## 2013-06-24 DIAGNOSIS — R634 Abnormal weight loss: Secondary | ICD-10-CM

## 2013-06-24 DIAGNOSIS — Z01419 Encounter for gynecological examination (general) (routine) without abnormal findings: Secondary | ICD-10-CM

## 2013-06-24 LAB — COMPREHENSIVE METABOLIC PANEL
Albumin: 4.9 g/dL (ref 3.5–5.2)
Alkaline Phosphatase: 63 U/L (ref 39–117)
BUN: 11 mg/dL (ref 6–23)
Creat: 0.9 mg/dL (ref 0.50–1.10)
Glucose, Bld: 100 mg/dL — ABNORMAL HIGH (ref 70–99)
Potassium: 4 mEq/L (ref 3.5–5.3)
Total Bilirubin: 0.8 mg/dL (ref 0.3–1.2)

## 2013-06-24 LAB — LIPID PANEL
HDL: 81 mg/dL (ref >39)
LDL Cholesterol: 103 mg/dL — ABNORMAL HIGH (ref 0–99)
Total CHOL/HDL Ratio: 2.4 Ratio
Triglycerides: 55 mg/dL (ref <150)

## 2013-06-24 LAB — TSH: TSH: 0.548 u[IU]/mL (ref 0.350–4.500)

## 2013-06-25 LAB — URINALYSIS W MICROSCOPIC + REFLEX CULTURE
Casts: NONE SEEN
Hgb urine dipstick: NEGATIVE
Nitrite: NEGATIVE
pH: 7.5 (ref 5.0–8.0)

## 2013-06-26 LAB — URINE CULTURE
Colony Count: NO GROWTH
Organism ID, Bacteria: NO GROWTH

## 2013-08-16 ENCOUNTER — Encounter: Payer: Self-pay | Admitting: *Deleted

## 2013-10-14 ENCOUNTER — Other Ambulatory Visit: Payer: Self-pay

## 2014-02-10 ENCOUNTER — Encounter: Payer: Self-pay | Admitting: Gynecology

## 2014-02-18 ENCOUNTER — Encounter: Payer: Self-pay | Admitting: Gynecology

## 2014-03-09 ENCOUNTER — Emergency Department (INDEPENDENT_AMBULATORY_CARE_PROVIDER_SITE_OTHER): Payer: BC Managed Care – PPO

## 2014-03-09 ENCOUNTER — Encounter: Payer: Self-pay | Admitting: Emergency Medicine

## 2014-03-09 ENCOUNTER — Emergency Department
Admission: EM | Admit: 2014-03-09 | Discharge: 2014-03-09 | Disposition: A | Discharge status: Home / Self Care | Payer: BC Managed Care – PPO | Source: Home / Self Care | Attending: Family Medicine | Admitting: Family Medicine

## 2014-03-09 DIAGNOSIS — R933 Abnormal findings on diagnostic imaging of other parts of digestive tract: Secondary | ICD-10-CM

## 2014-03-09 DIAGNOSIS — K5289 Other specified noninfective gastroenteritis and colitis: Secondary | ICD-10-CM

## 2014-03-09 DIAGNOSIS — R109 Unspecified abdominal pain: Secondary | ICD-10-CM

## 2014-03-09 LAB — POCT URINALYSIS DIP (MANUAL ENTRY)
BILIRUBIN UA: NEGATIVE
BILIRUBIN UA: NEGATIVE
Glucose, UA: NEGATIVE
LEUKOCYTES UA: NEGATIVE
Nitrite, UA: NEGATIVE
PH UA: 6 (ref 5–8)
PROTEIN UA: NEGATIVE
RBC UA: NEGATIVE
Spec Grav, UA: 1.01 (ref 1.005–1.03)
Urobilinogen, UA: 0.2 (ref 0–1)

## 2014-03-09 LAB — POCT CBC W AUTO DIFF (K'VILLE URGENT CARE)

## 2014-03-09 MED ORDER — IOHEXOL 300 MG/ML  SOLN
100.0000 mL | Freq: Once | INTRAMUSCULAR | Status: AC | PRN
  Administered 2014-03-09: 100 mL via INTRAVENOUS

## 2014-03-09 MED ORDER — HYDROCODONE-ACETAMINOPHEN 5-325 MG PO TABS: 1.0000 | ORAL_TABLET | Freq: Four times a day (QID) | ORAL | Status: DC | PRN

## 2014-03-09 MED ORDER — METRONIDAZOLE 500 MG PO TABS: 500.0000 mg | ORAL_TABLET | Freq: Four times a day (QID) | ORAL | Status: DC

## 2014-03-09 MED ORDER — CIPROFLOXACIN HCL 750 MG PO TABS: 750.0000 mg | ORAL_TABLET | Freq: Two times a day (BID) | ORAL | Status: DC

## 2014-03-09 NOTE — Discharge Instructions (Signed)
Begin clear liquids for 24 hours, then gradually advance to a bland diet, such as a "BRAT" diet (Bananas, Rice, Applesauce, Toast)  If symptoms become significantly worse during the night or over the weekend, proceed to the local emergency room.

## 2014-03-09 NOTE — ED Notes (Signed)
Pt c/o intermittent RUQ abd pain x 1 day with some decreased appetite. Denies fever or N/V/D.

## 2014-03-09 NOTE — ED Provider Notes (Signed)
CSN: [id number]     Arrival date & time 03/09/14  1451 History   First MD Initiated Contact with Patient 03/09/14 1528     Chief Complaint  Patient presents with  . Abdominal Pain      HPI Comments: At midday yesterday patient noticed the onset of an intermittent brief lancinating pain in her right lower quadrant that recurs every several minutes.  The pain does not radiate and has not changed position.  The pain is worse with movement, and she had difficulty sleeping last night.  No nausea/vomiting but her appetite has been decreased.  No fevers, chills, and sweats.  No urinary symptoms.  Her bowel movements have been normal. She plans on leaving for a 2 week vacation in 3 days. Past history of complete hysterectomy 2000 as result of endometriosis Family history of ovarian CA in her mother.                                                                  Patient is a 48 y.o. female presenting with abdominal pain. The history is provided by the patient.  Abdominal Pain This is a new problem. The current episode started yesterday. The problem occurs hourly. The problem has been gradually worsening. Associated symptoms include abdominal pain. Pertinent negatives include no chest pain and no shortness of breath. The symptoms are aggravated by walking. Nothing relieves the symptoms. She has tried nothing for the symptoms.    Past Medical History  Diagnosis Date  . Endometriosis    Past Surgical History  Procedure Laterality Date  . Abdominal hysterectomy  2000    BSO  . Cesarean section     Family History  Problem Relation Age of Onset  . Cancer Mother     OVARIAN   History  Substance Use Topics  . Smoking status: Never Smoker   . Smokeless tobacco: Never Used  . Alcohol Use: No   OB History   Grav Para Term Preterm Abortions TAB SAB Ect Mult Living   1 1 1       1      Review of Systems  Constitutional: Positive for appetite change. Negative for fever, chills, diaphoresis,  activity change and fatigue.  HENT: Negative.   Eyes: Negative.   Respiratory: Negative.  Negative for shortness of breath.   Cardiovascular: Negative for chest pain.  Gastrointestinal: Positive for abdominal pain. Negative for nausea, vomiting, diarrhea, constipation, blood in stool, abdominal distention, anal bleeding and rectal pain.  Genitourinary: Negative.   Musculoskeletal: Negative.   Skin: Negative.     Allergies  Review of patient's allergies indicates no known allergies.  Home Medications   Current Outpatient Rx  Name  Route  Sig  Dispense  Refill  . calcium carbonate (OS-CAL) 600 MG TABS   Oral   Take 600 mg by mouth 2 (two) times daily with a meal.         . ciprofloxacin (CIPRO) 750 MG tablet   Oral   Take 1 tablet (750 mg total) by mouth 2 (two) times daily.   14 tablet   0   . Estradiol 10 MCG TABS vaginal tablet   Vaginal   Place 1 tablet (10 mcg total) vaginally 2 (two) times a week.  8 tablet   11   . HYDROcodone-acetaminophen (NORCO/VICODIN) 5-325 MG per tablet   Oral   Take 1 tablet by mouth every 6 (six) hours as needed for moderate pain.   10 tablet   0   . metroNIDAZOLE (FLAGYL) 500 MG tablet   Oral   Take 1 tablet (500 mg total) by mouth 4 (four) times daily.   28 tablet   0   . Nutritional Supplements (JUICE PLUS FIBRE PO)   Oral   Take by mouth.         Marland Kitchen. PARoxetine (PAXIL) 10 MG tablet   Oral   Take 1 tablet (10 mg total) by mouth every morning.   30 tablet   6    BP 115/80  Pulse 68  Temp(Src) 98.2 F (36.8 C) (Oral)  Resp 18  Ht 5\' 2"  (1.575 m)  Wt 141 lb (63.957 kg)  BMI 25.78 kg/m2  SpO2 100% Physical Exam Nursing notes and Vital Signs reviewed. Appearance:  Patient appears healthy, stated age, and in no acute distress.  She is alert and oriented.  Eyes:  Pupils are equal, round, and reactive to light and accomodation.  Extraocular movement is intact.  Conjunctivae are not inflamed  Pharynx:  Normal; moist  mucous membranes  Neck:  Supple; no adenopathy Lungs:  Clear to auscultation.  Breath sounds are equal.  Heart:  Regular rate and rhythm without murmurs, rubs, or gallops.  Abdomen:  Distinct tenderness right lower quadrant over McBurney's point without masses or hepatosplenomegaly.  No rebound tenderness.  Bowel sounds are present.  No CVA or flank tenderness.  Negative iliopsoas and obdurator tests. Extremities:  No edema.  No calf tenderness Skin:  No rash present.   ED Course  Procedures  none    Labs Reviewed  SEDIMENTATION RATE  COMPLETE METABOLIC PANEL WITH GFR  POCT URINALYSIS DIP (MANUAL ENTRY) Negative  POCT CBC W AUTO DIFF ([facility]):  WBC 10.4; LY 18.3; MO 4.8; GR 76.9; Hgb 13.7; Platelets 280    Imaging Review Ct Abdomen Pelvis W Contrast  03/09/2014   CLINICAL DATA:  Right lower quadrant pain.  Nausea.  EXAM: CT ABDOMEN AND PELVIS WITH CONTRAST  TECHNIQUE: Multidetector CT imaging of the abdomen and pelvis was performed using the standard protocol following bolus administration of intravenous contrast.  CONTRAST:  100mL OMNIPAQUE IOHEXOL 300 MG/ML  SOLN  COMPARISON:  None.  FINDINGS: Focal low-attenuation at the falciform ligament of the liver is in a characteristic location for focal fatty infiltration. Liver is otherwise normal in appearance. The spleen is normal. Stomach, duodenum, pancreas, gallbladder, and adrenal glands are normal. The 6 mm low-density lesion in the cortex of the left interpolar kidney is too small to characterize but it likely represents a tiny cyst. The right kidney is unremarkable.  No abdominal aortic aneurysm. There is no free fluid or lymphadenopathy in the abdomen.  Imaging through the pelvis shows no free intraperitoneal fluid. No pelvic sidewall lymphadenopathy. The uterus is surgically absent. The bladder is unremarkable. No evidence for an adnexal mass.  Colon is unremarkable. The terminal ileum is focally dilated up to 3.0 cm in diameter.  Distal ileum proximal to this region shows fecalization of contents, suggesting dysmotility. There is perienteric edema/ inflammation associated with about 10 cm of the terminal ileum. As the terminal ileum tracks into the ileocecal valve but remains distended and there is subtle eccentric wall thickening. There is mild/subtle edema/wall thickening in the cecal tip.  The  appendix is identified posterior to the terminal ileum, measuring upper normal diameter it 6-7 mm and is filled with air and stool. No evidence for appendiceal wall thickening. The appendix does not appear to be the epicenter of the edema/inflammatory changes.  Bone windows reveal no worrisome lytic or sclerotic osseous lesions.  IMPRESSION: Dilated terminal ileum with subtle eccentric wall thickening and perienteric edema/ inflammation. Also, the cecal tip is ill-defined which suggests some edema/inflammation within the cecal tip as well. The changes suggest an infectious or inflammatory cecitis or termin ileitis. 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  No evidence for appendiceal inflammation.  I called these results directly to Dr. Cathren Harsh at approximately 1714 hours on 03/09/2014.   Electronically Signed   By: Kennith Center M.D.   On: 03/09/2014 17:15     MDM   1. Abdominal pain, unspecified site   2. Abnormal computed tomography of cecum and terminal ileum     Sed Rate and CMP pending. Will empirically treat as an infectious process. Begin Cipro and Flagyl.  Rx for Lortab; advised to minimize use of Lortab, and use Tylenol when possible. Begin clear liquids for 24 hours, then gradually advance to a bland diet, such as a "BRAT" diet (Bananas, Rice, Applesauce, Toast)  If symptoms become significantly worse during the night or over the weekend, proceed to the local emergency  room.  Followup with gastroenterologist (will need a followup colonoscopy)    Lattie Haw, MD 03/09/14 [phone]

## 2014-03-10 LAB — COMPLETE METABOLIC PANEL WITH GFR
ALBUMIN: 5 g/dL (ref 3.5–5.2)
ALT: 23 U/L (ref 0–35)
AST: 16 U/L (ref 0–37)
Alkaline Phosphatase: 63 U/L (ref 39–117)
BUN: 13 mg/dL (ref 6–23)
CO2: 29 meq/L (ref 19–32)
Calcium: 9.9 mg/dL (ref 8.4–10.5)
Chloride: 99 mEq/L (ref 96–112)
Creat: 0.79 mg/dL (ref 0.50–1.10)
GFR, EST NON AFRICAN AMERICAN: 88 mL/min
GFR, Est African American: >89 mL/min
Glucose, Bld: 79 mg/dL (ref 70–99)
Potassium: 4.4 mEq/L (ref 3.5–5.3)
Sodium: 139 mEq/L (ref 135–145)
TOTAL PROTEIN: 7.6 g/dL (ref 6.0–8.3)
Total Bilirubin: 0.7 mg/dL (ref 0.2–1.2)

## 2014-03-10 LAB — SEDIMENTATION RATE: Sed Rate: 20 mm/hr (ref 0–22)

## 2014-03-12 ENCOUNTER — Telehealth: Payer: Self-pay | Admitting: *Deleted

## 2014-06-22 ENCOUNTER — Other Ambulatory Visit: Payer: Self-pay | Admitting: Gynecology

## 2014-06-22 ENCOUNTER — Ambulatory Visit (INDEPENDENT_AMBULATORY_CARE_PROVIDER_SITE_OTHER): Payer: BC Managed Care – PPO | Admitting: Gynecology

## 2014-06-22 ENCOUNTER — Encounter: Payer: Self-pay | Admitting: Gynecology

## 2014-06-22 VITALS — BP 106/70 | Ht 62.5 in | Wt 147.0 lb

## 2014-06-22 DIAGNOSIS — Z780 Asymptomatic menopausal state: Secondary | ICD-10-CM

## 2014-06-22 DIAGNOSIS — N952 Postmenopausal atrophic vaginitis: Secondary | ICD-10-CM

## 2014-06-22 DIAGNOSIS — Z01419 Encounter for gynecological examination (general) (routine) without abnormal findings: Secondary | ICD-10-CM

## 2014-06-22 DIAGNOSIS — R7989 Other specified abnormal findings of blood chemistry: Secondary | ICD-10-CM

## 2014-06-22 DIAGNOSIS — N951 Menopausal and female climacteric states: Secondary | ICD-10-CM

## 2014-06-22 DIAGNOSIS — Z79890 Hormone replacement therapy (postmenopausal): Secondary | ICD-10-CM

## 2014-06-22 LAB — TSH: TSH: 0.339 u[IU]/mL — ABNORMAL LOW (ref 0.350–4.500)

## 2014-06-22 LAB — COMPREHENSIVE METABOLIC PANEL
ALBUMIN: 4.6 g/dL (ref 3.5–5.2)
ALK PHOS: 57 U/L (ref 39–117)
ALT: 24 U/L (ref 0–35)
AST: 22 U/L (ref 0–37)
BILIRUBIN TOTAL: 0.6 mg/dL (ref 0.2–1.2)
BUN: 11 mg/dL (ref 6–23)
CO2: 30 mEq/L (ref 19–32)
Calcium: 9.5 mg/dL (ref 8.4–10.5)
Chloride: 102 mEq/L (ref 96–112)
Creat: 0.73 mg/dL (ref 0.50–1.10)
Glucose, Bld: 87 mg/dL (ref 70–99)
Potassium: 3.9 mEq/L (ref 3.5–5.3)
Sodium: 140 mEq/L (ref 135–145)
Total Protein: 6.9 g/dL (ref 6.0–8.3)

## 2014-06-22 LAB — CBC WITH DIFFERENTIAL/PLATELET
BASOS ABS: 0 10*3/uL (ref 0.0–0.1)
BASOS PCT: 0 % (ref 0–1)
Eosinophils Absolute: 0.1 10*3/uL (ref 0.0–0.7)
Eosinophils Relative: 2 % (ref 0–5)
HCT: 36.6 % (ref 36.0–46.0)
Hemoglobin: 12.5 g/dL (ref 12.0–15.0)
Lymphocytes Relative: 32 % (ref 12–46)
Lymphs Abs: 1.6 10*3/uL (ref 0.7–4.0)
MCH: 30 pg (ref 26.0–34.0)
MCHC: 34.2 g/dL (ref 30.0–36.0)
MCV: 88 fL (ref 78.0–100.0)
MONO ABS: 0.2 10*3/uL (ref 0.1–1.0)
Monocytes Relative: 4 % (ref 3–12)
NEUTROS PCT: 62 % (ref 43–77)
Neutro Abs: 3.2 10*3/uL (ref 1.7–7.7)
Platelets: 237 10*3/uL (ref 150–400)
RBC: 4.16 MIL/uL (ref 3.87–5.11)
RDW: 13.4 % (ref 11.5–15.5)
WBC: 5.1 10*3/uL (ref 4.0–10.5)

## 2014-06-22 LAB — LIPID PANEL
Cholesterol: 181 mg/dL (ref 0–200)
HDL: 76 mg/dL (ref >39)
LDL CALC: 89 mg/dL (ref 0–99)
Total CHOL/HDL Ratio: 2.4 Ratio
Triglycerides: 78 mg/dL (ref <150)
VLDL: 16 mg/dL (ref 0–40)

## 2014-06-22 MED ORDER — ESTRADIOL 10 MCG VA TABS: 1.0000 | ORAL_TABLET | VAGINAL | Status: DC

## 2014-06-22 NOTE — Patient Instructions (Signed)

## 2014-06-22 NOTE — Progress Notes (Signed)
Demara Thereasa DistanceSun Age [birth year] [id number]   History:    48 y.o.  for annual gyn exam who was seen in the emergency room in April of this year as a result of abdominal pain and a CT had been done which demonstrated the following:  Dilated terminal ileum with subtle eccentric wall thickening and  perienteric edema/ inflammation. Also, the cecal tip is ill-defined  which suggests some edema/inflammation within the cecal tip as well.  The changes suggest an infectious or inflammatory cecitis or termin  ileitis. There does appear to functional functional dysmotility in  the terminal ileum as evidenced by fecalization of distal small  bowel contents but no evidence for overt mechanical obstruction at  this time.  No evidence for appendiceal inflammation.  Patient and was treated for an infectious process by the ER physician with Cipro and Flagyl and patient subsequently [place] and she reports that she only had some benign polyps removed. Since then she had one more episode of old, pain but is having no symptoms currently. She is interested in seeking a second opinion by another gastroenterologist here in our community.  Patient with mild vasomotor symptoms has tried multiple estrogen regimens in the past. She is currently taking Vagifem 10 mcg twice a week for vaginal atrophy. Patient has past history of total abdominal hysterectomy with bilateral salpingo-oophorectomy in 2000 as a result of severe endometriosis. She has been on HRT since 2013.  Patient would no prior history of any abnormal Pap smears prior to her hysterectomy  Past medical history,surgical history, family history and social history were all reviewed and documented in the EPIC chart.  Gynecologic History No LMP recorded. Patient has had a hysterectomy. Contraception: status post hysterectomy Last Pap: 2012. Results were: normal Last mammogram: 2015. Results were: Normal but  dense  Obstetric History OB History  Gravida Para Term Preterm AB SAB TAB Ectopic Multiple Living  1 1 1       1     # Outcome Date GA Lbr Len/2nd Weight Sex Delivery Anes PTL Lv  1 TRM     F CS  N Y       ROS: A ROS was performed and pertinent positives and negatives are included in the history.  GENERAL: No fevers or chills. HEENT: No change in vision, no earache, sore throat or sinus congestion. NECK: No pain or stiffness. CARDIOVASCULAR: No chest pain or pressure. No palpitations. PULMONARY: No shortness of breath, cough or wheeze. GASTROINTESTINAL: No abdominal pain, nausea, vomiting or diarrhea, melena or bright red blood per rectum. GENITOURINARY: No urinary frequency, urgency, hesitancy or dysuria. MUSCULOSKELETAL: No joint or muscle pain, no back pain, no recent trauma. DERMATOLOGIC: No rash, no itching, no lesions. ENDOCRINE: No polyuria, polydipsia, no heat or cold intolerance. No recent change in weight. HEMATOLOGICAL: No anemia or easy bruising or bleeding. NEUROLOGIC: No headache, seizures, numbness, tingling or weakness. PSYCHIATRIC: No depression, no loss of interest in normal activity or change in sleep pattern.     Exam: chaperone present  BP 106/70  Ht 5' 2.5" (1.588 m)  Wt 147 lb (66.679 kg)  BMI 26.44 kg/m2  Body mass index is 26.44 kg/(m^2).  General appearance : Well developed well nourished female. No acute distress HEENT: Neck supple, trachea midline, no carotid bruits, no thyroidmegaly Lungs: Clear to auscultation, no rhonchi or wheezes, or rib retractions  Heart: Regular rate and rhythm, no murmurs or gallops Breast:Examined in sitting and supine  position were symmetrical in appearance, no palpable masses or tenderness,  no skin retraction, no nipple inversion, no nipple discharge, no skin discoloration, no axillary or supraclavicular lymphadenopathy Abdomen: no palpable masses or tenderness, no rebound or guarding Extremities: no edema or skin discoloration  or tenderness  Pelvic:  Bartholin, Urethra, Skene Glands: Within normal limits             Vagina: No gross lesions or discharge  Cervix: Absent  Uterus  Absent  Adnexa  Without masses or tenderness  Anus and perineum  normal   Rectovaginal  normal sphincter tone without palpated masses or tenderness             Hemoccult not indicated     Assessment/Plan:  48 y.o. female for annual exam doing well postmenopausal (surgical menopause). We stressed the importance of calcium and vitamin D in regular exercise for osteoporosis prevention. Patient had normal bone density study in 2009 and 2013. Patient will schedule a bone density study in the next few weeks. We discussed the importance of monthly breast exams as well. Pap smear not done as a result of the new guidelines. I will give her the name and number of one of my GI colleagues here in our community for her to seek a second opinion and she will obtain those records from the gastroenterologist in [facility]. Prescription refill for Vagifem 10 mcg to apply 2 times to 3 times a week for vaginal atrophy. The following labs ordered today: CBC, comprehensive metabolic panel, and lipid profile, TSH and urinalysis. Patient had a negative hepatitis screen last year and her T. that vaccine wasn't 2014 as well.  Note: This dictation was prepared with  Dragon/digital dictation along withSmart phrase technology. Any transcriptional errors that result from this process are unintentional.   Ok Edwards MD, 10:07 AM 06/22/2014

## 2014-06-23 LAB — URINALYSIS W MICROSCOPIC + REFLEX CULTURE
Bacteria, UA: NONE SEEN
Bilirubin Urine: NEGATIVE
Casts: NONE SEEN
Crystals: NONE SEEN
Glucose, UA: NEGATIVE mg/dL
Hgb urine dipstick: NEGATIVE
Ketones, ur: NEGATIVE mg/dL
Leukocytes, UA: NEGATIVE
NITRITE: NEGATIVE
PROTEIN: NEGATIVE mg/dL
Specific Gravity, Urine: 1.016 (ref 1.005–1.030)
Squamous Epithelial / LPF: NONE SEEN
UROBILINOGEN UA: 0.2 mg/dL (ref 0.0–1.0)
pH: 7.5 (ref 5.0–8.0)

## 2014-06-29 ENCOUNTER — Other Ambulatory Visit: Payer: BC Managed Care – PPO

## 2014-06-29 DIAGNOSIS — R7989 Other specified abnormal findings of blood chemistry: Secondary | ICD-10-CM

## 2014-06-29 LAB — THYROID PANEL WITH TSH
Free Thyroxine Index: 2.1 (ref 1.0–3.9)
T3 UPTAKE: 35.8 % (ref 22.5–37.0)
T4 TOTAL: 6 ug/dL (ref 5.0–12.5)
TSH: 0.35 u[IU]/mL (ref 0.350–4.500)

## 2014-07-07 ENCOUNTER — Other Ambulatory Visit: Payer: Self-pay | Admitting: Gynecology

## 2014-07-07 ENCOUNTER — Ambulatory Visit (INDEPENDENT_AMBULATORY_CARE_PROVIDER_SITE_OTHER): Payer: BC Managed Care – PPO

## 2014-07-07 DIAGNOSIS — Z13820 Encounter for screening for osteoporosis: Secondary | ICD-10-CM

## 2014-07-07 DIAGNOSIS — Z780 Asymptomatic menopausal state: Secondary | ICD-10-CM

## 2014-10-10 ENCOUNTER — Encounter: Payer: Self-pay | Admitting: Gynecology

## 2014-10-11 ENCOUNTER — Ambulatory Visit: Payer: BC Managed Care – PPO | Attending: Orthopedic Surgery

## 2014-10-11 DIAGNOSIS — Z5189 Encounter for other specified aftercare: Secondary | ICD-10-CM | POA: Insufficient documentation

## 2014-10-11 DIAGNOSIS — M25511 Pain in right shoulder: Secondary | ICD-10-CM | POA: Diagnosis not present

## 2014-10-18 ENCOUNTER — Ambulatory Visit: Payer: BC Managed Care – PPO

## 2014-10-18 DIAGNOSIS — Z5189 Encounter for other specified aftercare: Secondary | ICD-10-CM | POA: Diagnosis not present

## 2014-10-31 ENCOUNTER — Ambulatory Visit: Payer: BC Managed Care – PPO | Admitting: Rehabilitation

## 2014-10-31 DIAGNOSIS — Z5189 Encounter for other specified aftercare: Secondary | ICD-10-CM | POA: Diagnosis not present

## 2015-03-17 ENCOUNTER — Telehealth: Payer: Self-pay | Admitting: *Deleted

## 2015-03-17 NOTE — Telephone Encounter (Signed)
Pt called with questions about travel [place] wanted to get these here. I left a detailed message for patient that we do not handle those vaccines here, [facility]. Advised we have her TDAP documented here 06/21/13. KW CMA

## 2015-03-17 NOTE — Telephone Encounter (Signed)
[place] and is requesting a RX for typhoid pills. I told her I am not sure we do that but I would ask. Pls advise. KW CMA

## 2015-03-27 NOTE — Telephone Encounter (Signed)
[facility] Where they will give it to her there.

## 2015-03-27 NOTE — Telephone Encounter (Signed)
Pt informed KW

## 2015-06-28 ENCOUNTER — Encounter: Payer: Self-pay | Admitting: Gynecology

## 2015-07-10 ENCOUNTER — Other Ambulatory Visit: Payer: Self-pay | Admitting: Gynecology

## 2015-07-19 ENCOUNTER — Encounter: Payer: Self-pay | Admitting: Gynecology

## 2015-07-19 ENCOUNTER — Ambulatory Visit (INDEPENDENT_AMBULATORY_CARE_PROVIDER_SITE_OTHER): Payer: BLUE CROSS/BLUE SHIELD | Admitting: Gynecology

## 2015-07-19 VITALS — BP 112/78 | Ht 62.5 in | Wt 150.0 lb

## 2015-07-19 DIAGNOSIS — Z79890 Hormone replacement therapy (postmenopausal): Secondary | ICD-10-CM | POA: Diagnosis not present

## 2015-07-19 DIAGNOSIS — Z01419 Encounter for gynecological examination (general) (routine) without abnormal findings: Secondary | ICD-10-CM

## 2015-07-19 LAB — COMPREHENSIVE METABOLIC PANEL
ALBUMIN: 4.7 g/dL (ref 3.6–5.1)
ALK PHOS: 58 U/L (ref 33–130)
ALT: 16 U/L (ref 6–29)
AST: 18 U/L (ref 10–35)
BILIRUBIN TOTAL: 0.8 mg/dL (ref 0.2–1.2)
BUN: 13 mg/dL (ref 7–25)
CO2: 27 mmol/L (ref 20–31)
Calcium: 9.4 mg/dL (ref 8.6–10.4)
Chloride: 101 mmol/L (ref 98–110)
Creat: 0.88 mg/dL (ref 0.50–1.05)
GLUCOSE: 85 mg/dL (ref 65–99)
Potassium: 3.8 mmol/L (ref 3.5–5.3)
Sodium: 139 mmol/L (ref 135–146)
Total Protein: 7.2 g/dL (ref 6.1–8.1)

## 2015-07-19 LAB — LIPID PANEL
CHOLESTEROL: 192 mg/dL (ref 125–200)
HDL: 94 mg/dL (ref >=46)
LDL CALC: 89 mg/dL (ref <130)
TRIGLYCERIDES: 43 mg/dL (ref <150)
Total CHOL/HDL Ratio: 2 Ratio (ref <=5.0)
VLDL: 9 mg/dL (ref <30)

## 2015-07-19 LAB — CBC WITH DIFFERENTIAL/PLATELET
BASOS ABS: 0 10*3/uL (ref 0.0–0.1)
BASOS PCT: 0 % (ref 0–1)
Eosinophils Absolute: 0.1 10*3/uL (ref 0.0–0.7)
Eosinophils Relative: 1 % (ref 0–5)
HCT: 37.8 % (ref 36.0–46.0)
Hemoglobin: 12.9 g/dL (ref 12.0–15.0)
LYMPHS PCT: 37 % (ref 12–46)
Lymphs Abs: 2.4 10*3/uL (ref 0.7–4.0)
MCH: 30.8 pg (ref 26.0–34.0)
MCHC: 34.1 g/dL (ref 30.0–36.0)
MCV: 90.2 fL (ref 78.0–100.0)
MONO ABS: 0.3 10*3/uL (ref 0.1–1.0)
MPV: 8.5 fL — AB (ref 8.6–12.4)
Monocytes Relative: 5 % (ref 3–12)
NEUTROS PCT: 57 % (ref 43–77)
Neutro Abs: 3.7 10*3/uL (ref 1.7–7.7)
PLATELETS: 249 10*3/uL (ref 150–400)
RBC: 4.19 MIL/uL (ref 3.87–5.11)
RDW: 14.3 % (ref 11.5–15.5)
WBC: 6.5 10*3/uL (ref 4.0–10.5)

## 2015-07-19 MED ORDER — ESTRADIOL 10 MCG VA TABS: ORAL_TABLET | VAGINAL | Status: DC

## 2015-07-19 NOTE — Patient Instructions (Signed)
Estradiol vaginal tablets What is this medicine? ESTRADIOL (es tra DYE ole) vaginal tablet is used to help relieve symptoms of vaginal irritation and dryness that occurs in some women during menopause. This medicine may be used for other purposes; ask your health care provider or pharmacist if you have questions. COMMON BRAND NAME(S): Vagifem What should I tell my health care provider before I take this medicine? They need to know if you have any of these conditions: -abnormal vaginal bleeding -blood vessel disease or blood clots -breast, cervical, endometrial, ovarian, liver, or uterine cancer -dementia -diabetes -gallbladder disease -heart disease or recent heart attack -high blood pressure -high cholesterol -high level of calcium in the blood -hysterectomy -kidney disease -liver disease -migraine headaches -protein C deficiency -protein S deficiency -stroke -systemic lupus erythematosus (SLE) -tobacco smoker -an unusual or allergic reaction to estrogens, other hormones, medicines, foods, dyes, or preservatives -pregnant or trying to get pregnant -breast-feeding How should I use this medicine? This medicine is only for use in the vagina. Do not take by mouth. Wash your hands before and after use. Read package directions carefully. Unwrap the pre-filled applicator package. Lie on your back, part and bend your knees. Gently insert the applicator tip high in the vagina and push the plunger to release the tablet into the vagina. Gently remove the applicator. Throw away the applicator after use. Do not use your medicine more often than directed. Finish the full course prescribed by your doctor or health care professional even if you think your condition is better. Do not stop using except on the advice of your doctor or health care professional. Talk to your pediatrician regarding the use of this medicine in children. A patient package insert for the product will be given with each  prescription and refill. Read this sheet carefully each time. The sheet may change frequently. Overdosage: If you think you have taken too much of this medicine contact a poison control center or emergency room at once. NOTE: This medicine is only for you. Do not share this medicine with others. What if I miss a dose? If you miss a dose, take it as soon as you can. If it is almost time for your next dose, take only that dose. Do not take double or extra doses. What may interact with this medicine? Do not take this medicine with any of the following medications: -aromatase inhibitors like aminoglutethimide, anastrozole, exemestane, letrozole, testolactone This medicine may also interact with the following medications: -antibiotics used to treat tuberculosis like rifabutin, rifampin and rifapentene -raloxifene or tamoxifen -warfarin This list may not describe all possible interactions. Give your health care provider a list of all the medicines, herbs, non-prescription drugs, or dietary supplements you use. Also tell them if you smoke, drink alcohol, or use illegal drugs. Some items may interact with your medicine. What should I watch for while using this medicine? Visit your health care professional for regular checks on your progress. You will need a regular breast and pelvic exam. You should also discuss the need for regular mammograms with your health care professional, and follow his or her guidelines. This medicine can make your body retain fluid, making your fingers, hands, or ankles swell. Your blood pressure can go up. Contact your doctor or health care professional if you feel you are retaining fluid. If you have any reason to think you are pregnant; stop taking this medicine at once and contact your doctor or health care professional. Tobacco smoking increases the risk of getting   a blood clot or having a stroke, especially if you are more than 51 years old. You are strongly advised not to  smoke. If you wear contact lenses and notice visual changes, or if the lenses begin to feel uncomfortable, consult your eye care specialist. If you are going to have elective surgery, you may need to stop taking this medicine beforehand. Consult your health care professional for advice prior to scheduling the surgery. What side effects may I notice from receiving this medicine? Side effects that you should report to your doctor or health care professional as soon as possible: -allergic reactions like skin rash, itching or hives, swelling of the face, lips, or tongue -breast tissue changes or discharge -changes in vision -chest pain -confusion, trouble speaking or understanding -dark urine -general ill feeling or flu-like symptoms -light-colored stools -nausea, vomiting -pain, swelling, warmth in the leg -right upper belly pain -severe headaches -shortness of breath -sudden numbness or weakness of the face, arm or leg -trouble walking, dizziness, loss of balance or coordination -unusual vaginal bleeding -yellowing of the eyes or skin Side effects that usually do not require medical attention (report to your doctor or health care professional if they continue or are bothersome): -hair loss -increased hunger or thirst -increased urination -symptoms of vaginal infection like itching, irritation or unusual discharge -unusually weak or tired This list may not describe all possible side effects. Call your doctor for medical advice about side effects. You may report side effects to FDA at 1-800-FDA-1088. Where should I keep my medicine? Keep out of the reach of children. Store at room temperature between 15 and 30 degrees C (59 and 86 degrees F). Throw away any unused medicine after the expiration date. NOTE: This sheet is a summary. It may not cover all possible information. If you have questions about this medicine, talk to your doctor, pharmacist, or health care provider.  2015,  Elsevier/Gold Standard. (2011-02-27 09:08:58)  

## 2015-07-19 NOTE — Progress Notes (Signed)
[name] [name] [birth year] [id number]   History:    49 y.o.  for annual gyn exam with no complaints today.Patient with mild vasomotor symptoms has tried multiple estrogen regimens in the past. She is currently taking Vagifem 10 mcg twice a week for vaginal atrophy. Patient has past history of total abdominal hysterectomy with bilateral salpingo-oophorectomy in 2000 as a result of severe endometriosis. She has been on HRT since 2013.  Patient would no prior history of any abnormal Pap smears prior to her hysterectomy patient had normal bone density study in 2015   Past medical history,surgical history, family history and social history were all reviewed and documented in the EPIC chart.  Gynecologic History No LMP recorded. Patient has had a hysterectomy. Contraception: post menopausal status, hysterectomy Last Pap: 2012. Results were: normal Last mammogram: 2015. Results were: normal  Obstetric History OB History  Gravida Para Term Preterm AB SAB TAB Ectopic Multiple Living  # Outcome Date GA Lbr Len/2nd Weight Sex Delivery Anes PTL Lv  1 Term     F CS-Unspec  N Y       ROS: A ROS was performed and pertinent positives and negatives are included in the history.  GENERAL: No fevers or chills. HEENT: No change in vision, no earache, sore throat or sinus congestion. NECK: No pain or stiffness. CARDIOVASCULAR: No chest pain or pressure. No palpitations. PULMONARY: No shortness of breath, cough or wheeze. GASTROINTESTINAL: No abdominal pain, nausea, vomiting or diarrhea, melena or bright red blood per rectum. GENITOURINARY: No urinary frequency, urgency, hesitancy or dysuria. MUSCULOSKELETAL: No joint or muscle pain, no back pain, no recent trauma. DERMATOLOGIC: No rash, no itching, no lesions. ENDOCRINE: No polyuria, polydipsia, no heat or cold intolerance. No recent change in weight. HEMATOLOGICAL: No anemia or easy bruising or bleeding. NEUROLOGIC: No headache, seizures,  numbness, tingling or weakness. PSYCHIATRIC: No depression, no loss of interest in normal activity or change in sleep pattern.     Exam: chaperone present  BP 112/78 mmHg  Ht 5' 2.5" (1.588 m)  Wt 150 lb (68.04 kg)  BMI 26.98 kg/m2  Body mass index is 26.98 kg/(m^2).  General appearance : Well developed well nourished female. No acute distress HEENT: Eyes: no retinal hemorrhage or exudates,  Neck supple, trachea midline, no carotid bruits, no thyroidmegaly Lungs: Clear to auscultation, no rhonchi or wheezes, or rib retractions  Heart: Regular rate and rhythm, no murmurs or gallops Breast:Examined in sitting and supine position were symmetrical in appearance, no palpable masses or tenderness,  no skin retraction, no nipple inversion, no nipple discharge, no skin discoloration, no axillary or supraclavicular lymphadenopathy Abdomen: no palpable masses or tenderness, no rebound or guarding Extremities: no edema or skin discoloration or tenderness  Pelvic:  Bartholin, Urethra, Skene Glands: Within normal limits             Vagina: No gross lesions or discharge  Cervix: Absent  Uterus  absent  Adnexa  Without masses or tenderness  Anus and perineum  normal   Rectovaginal  normal sphincter tone without palpated masses or tenderness             Hemoccult cards provided     Assessment/Plan:  49 y.o. female for annual exam doing well on Vagifem 10 g twice a week she's going to increase it to 3 times a week. Risk benefits and pros and cons discussed. The following screening blood  work was ordered today: Comprehensive metabolic panel, fasting lipid profile, TSH, CBC, and urinalysis. Pap smear was done today. We discussed importance of calcium vitamin D and regular exercise for osteoporosis prevention. She was reminded to smoke to the office a fecal Hemoccult cards for testing at a later date.   Ok Edwards MD, 11:42 AM 07/19/2015

## 2015-07-20 ENCOUNTER — Other Ambulatory Visit: Payer: Self-pay | Admitting: Gynecology

## 2015-07-20 DIAGNOSIS — R7989 Other specified abnormal findings of blood chemistry: Secondary | ICD-10-CM

## 2015-07-20 LAB — URINALYSIS W MICROSCOPIC + REFLEX CULTURE
Bacteria, UA: NONE SEEN [HPF]
Bilirubin Urine: NEGATIVE
Casts: NONE SEEN [LPF]
Crystals: NONE SEEN [HPF]
Glucose, UA: NEGATIVE
HGB URINE DIPSTICK: NEGATIVE
KETONES UR: NEGATIVE
LEUKOCYTES UA: NEGATIVE
NITRITE: NEGATIVE
Protein, ur: NEGATIVE
RBC / HPF: NONE SEEN RBC/HPF (ref <=2)
Specific Gravity, Urine: 1.025 (ref 1.001–1.035)
Squamous Epithelial / LPF: NONE SEEN [HPF] (ref <=5)
WBC UA: NONE SEEN WBC/HPF (ref <=5)
YEAST: NONE SEEN [HPF]
pH: 6 (ref 5.0–8.0)

## 2015-07-20 LAB — TSH: TSH: 0.345 u[IU]/mL — ABNORMAL LOW (ref 0.350–4.500)

## 2015-07-21 ENCOUNTER — Other Ambulatory Visit: Payer: BLUE CROSS/BLUE SHIELD

## 2015-07-21 DIAGNOSIS — R7989 Other specified abnormal findings of blood chemistry: Secondary | ICD-10-CM

## 2015-07-21 LAB — THYROID PANEL WITH TSH
FREE THYROXINE INDEX: 1.9 (ref 1.4–3.8)
T3 Uptake: 29 % (ref 22–35)
T4 TOTAL: 6.6 ug/dL (ref 4.5–12.0)
TSH: 0.341 u[IU]/mL — ABNORMAL LOW (ref 0.350–4.500)

## 2015-07-24 ENCOUNTER — Telehealth: Payer: Self-pay | Admitting: *Deleted

## 2015-07-24 DIAGNOSIS — R7989 Other specified abnormal findings of blood chemistry: Secondary | ICD-10-CM

## 2015-07-24 NOTE — Telephone Encounter (Signed)
-----   Message from Keenan Bachelor, [place] sent at 07/21/2015  4:29 PM EDT ----- Regarding: referral to Dr. Elvera Lennox Per Dr. Glenetta Hew "Please inform patient that I would like to refer her to the medical endocrinologist for further evaluation of her thyroid function test. Her TSH is slightly low but the rest of her panel is normal in old like for this to be evaluated further."  I left patient a message about results and told her she'd hear next week regarding appt.

## 2015-07-24 NOTE — Telephone Encounter (Signed)
Referral placed they will contact pt to schedule. 

## 2015-07-31 ENCOUNTER — Other Ambulatory Visit: Payer: Self-pay

## 2015-07-31 ENCOUNTER — Telehealth: Payer: Self-pay | Admitting: *Deleted

## 2015-07-31 DIAGNOSIS — Z1231 Encounter for screening mammogram for malignant neoplasm of breast: Secondary | ICD-10-CM

## 2015-07-31 MED ORDER — ESTRADIOL 10 MCG VA TABS: ORAL_TABLET | VAGINAL | Status: DC

## 2015-07-31 NOTE — Telephone Encounter (Signed)
Appointment 09/12/15 @ 8:15am

## 2015-07-31 NOTE — Telephone Encounter (Signed)
I spoke with patient and told her to call endocrinologist office as they have tried to call pt twice. Pt said she will call.

## 2015-07-31 NOTE — Telephone Encounter (Signed)
Pt called requesting to have Rx for Vagifem 10 mcg sent to [facility] in [place] , [facility] for will be sent

## 2015-09-06 ENCOUNTER — Ambulatory Visit
Admission: RE | Admit: 2015-09-06 | Discharge: 2015-09-06 | Disposition: A | Discharge status: Home / Self Care | Payer: BLUE CROSS/BLUE SHIELD | Source: Ambulatory Visit

## 2015-09-06 DIAGNOSIS — Z1231 Encounter for screening mammogram for malignant neoplasm of breast: Secondary | ICD-10-CM

## 2015-09-12 ENCOUNTER — Encounter: Payer: Self-pay | Admitting: Internal Medicine

## 2015-09-12 ENCOUNTER — Ambulatory Visit (INDEPENDENT_AMBULATORY_CARE_PROVIDER_SITE_OTHER): Payer: BLUE CROSS/BLUE SHIELD | Admitting: Internal Medicine

## 2015-09-12 VITALS — BP 112/64 | HR 66 | Temp 97.8°F | Resp 12 | Ht 62.5 in | Wt 155.6 lb

## 2015-09-12 DIAGNOSIS — E0590 Thyrotoxicosis, unspecified without thyrotoxic crisis or storm: Secondary | ICD-10-CM | POA: Diagnosis not present

## 2015-09-12 NOTE — Patient Instructions (Signed)
You have mild subclinical hyperthyroidism.  Please stop at the lab.  Please return in 1 year.  Please let me know if you develop the following symptoms:  Hyperthyroidism The thyroid is a large gland located in the lower front part of your neck. The thyroid helps control metabolism. Metabolism is how your body uses food. It controls metabolism with the hormone thyroxine. When the thyroid is overactive, it produces too much hormone. When this happens, these following problems may occur:   Nervousness  Heat intolerance  Weight loss (in spite of increase food intake)  Diarrhea  Change in hair or skin texture  Palpitations (heart skipping or having extra beats)  Tachycardia (rapid heart rate)  Loss of menstruation (amenorrhea)  Shaking of the hands CAUSES  Grave's Disease (the immune system attacks the thyroid gland). This is the most common cause.  Inflammation of the thyroid gland.  Tumor (usually benign) in the thyroid gland or elsewhere.  Excessive use of thyroid medications (both prescription and 'natural').  Excessive ingestion of Iodine. DIAGNOSIS  To prove hyperthyroidism, your caregiver may do blood tests and ultrasound tests. Sometimes the signs are hidden. It may be necessary for your caregiver to watch this illness with blood tests, either before or after diagnosis and treatment. TREATMENT Short-term treatment There are several treatments to control symptoms. Drugs called beta blockers may give some relief. Drugs that decrease hormone production will provide temporary relief in many people. These measures will usually not give permanent relief. Definitive therapy There are treatments available which can be discussed between you and your caregiver which will permanently treat the problem. These treatments range from surgery (removal of the thyroid), to the use of radioactive iodine (destroys the thyroid by radiation), to the use of antithyroid drugs (interfere with  hormone synthesis). The first two treatments are permanent and usually successful. They most often require hormone replacement therapy for life. This is because it is impossible to remove or destroy the exact amount of thyroid required to make a person euthyroid (normal). HOME CARE INSTRUCTIONS  See your caregiver if the problems you are being treated for get worse. Examples of this would be the problems listed above. SEEK MEDICAL CARE IF: Your general condition worsens. MAKE SURE YOU:   Understand these instructions.  Will watch your condition.  Will get help right away if you are not doing well or get worse. Document Released: 11/25/2005 Document Revised: 02/17/2012 Document Reviewed: 04/08/2007 [facility] Patient Information 2015 [place], [place]. This information is not intended to replace advice given to you by your health care provider. Make sure you discuss any questions you have with your health care provider.

## 2015-09-12 NOTE — Progress Notes (Addendum)
Patient ID: [name] [name], female   DOB: October 08, 1966, 49 y.o.   MRN: [id number]   HPI  [name] [name] is a 49 y.o.-year-old [place] female, referred by her PCP, Dr. Reynaldo Minium, for evaluation for thyrotoxicosis.  Pt was recently told that her TFTs are abnormal. Reviewing the chart, she has had slightly low TSH levels alternating with normal low levels for several years. Total T4 was normal.  I reviewed pt's thyroid tests: Lab Results  Component Value Date   TSH 0.341* 07/21/2015   TSH 0.345* 07/19/2015   TSH 0.350 06/29/2014   TSH 0.339* 06/22/2014   TSH 0.548 06/24/2013   TSH 0.295* 06/18/2012    Reviewed the full thyroid panels - normal TT4 levels: Component     Latest Ref Rng 06/29/2014 07/21/2015  T4, Total     4.5 - 12.0 ug/dL 6.0 6.6  T3 Uptake     22 - 35 % 35.8 29  Free Thyroxine Index     1.4 - 3.8 2.1 1.9  TSH     0.350 - 4.500 uIU/mL 0.350 0.341 (L)   Pt denies feeling nodules in neck, hoarseness, dysphagia/odynophagia, SOB with lying down; she c/o: - + fatigue - + Feeling a little hotter - No tremors - No anxiety - No palpitations - No hyperdefecation - No weight loss, + some weight gain - No hair loss - + dry skin  - for a long time.  She exercises 1-2x a week >> good tolerance.  Pt does have a FH of thyroid ds.: 1 sister - ? Hyperthyroidism, 1 sister - thyroid nodule. No FH of thyroid cancer. No h/o radiation tx to head or neck.  No seaweed or kelp, no recent contrast studies. No steroid use. No herbal supplements. Takes Hair Skin and Nails vitamins rarely - not today.   She also has a history of surgical menopause in 2000.   ROS: Constitutional: + see HPI. Eyes: no blurry vision, no xerophthalmia ENT: no sore throat, no nodules palpated in throat, no dysphagia/odynophagia, no hoarseness Cardiovascular: no CP/SOB/palpitations/leg swelling Respiratory: no cough/SOB Gastrointestinal: no N/V/D/C Musculoskeletal: no muscle/joint aches Skin: no  rashes Neurological: no tremors/numbness/tingling/dizziness, + HA Psychiatric: no depression/anxiety  Past Medical History  Diagnosis Date  . Endometriosis    Past Surgical History  Procedure Laterality Date  . Abdominal hysterectomy  2000    BSO  . Cesarean section     Social History   Social History  . Marital Status: Married    Spouse Name: N/A  . 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 Smoking status: Never Smoker   . Smokeless tobacco: Never Used  . Alcohol Use: Red wine, 1 drink 1x a week  . Drug Use: No  . Sexual Activity: Yes    Birth Control/ Protection: Surgical     Comment: HYST   Current Outpatient Prescriptions on File Prior to Visit  Medication Sig Dispense Refill  . calcium carbonate (OS-CAL) 600 MG TABS Take 600 mg by mouth 2 (two) times daily with a meal.    . Estradiol (VAGIFEM) 10 MCG TABS vaginal tablet Apply one tablet three times a week vaginaly 12 tablet 11   No current facility-administered medications on file prior to visit.   No Known Allergies Family History  Problem Relation Age of Onset  . Cancer Mother     OVARIAN   PE: BP 112/64 mmHg  Pulse 66  Temp(Src) 97.8 F (36.6 C) (Oral)  Resp 12  Ht 5' 2.5" (1.588 m)  Wt 155 lb 9.6 oz (70.58 kg)  BMI 27.99 kg/m2  SpO2 98% Wt Readings from Last 3 Encounters:  09/12/15 155 lb 9.6 oz (70.58 kg)  07/19/15 150 lb (68.04 kg)  06/22/14 147 lb (66.679 kg)   Constitutional: normal weight, in NAD Eyes: PERRLA, EOMI, no exophthalmos, no lid lag, no stare ENT: moist mucous membranes, mild symmetric thyromegaly, no cervical lymphadenopathy Cardiovascular: RRR, No MRG Respiratory: CTA B Gastrointestinal: abdomen soft, NT, ND, BS+ Musculoskeletal: no deformities, strength intact in all 4 Skin: moist, warm, no rashes Neurological: no tremor with outstretched hands, DTR normal in all 4  ASSESSMENT: 1. Mild subclinical  thyrotoxicosis  PLAN:  1. Patient with persistent slightly low TSH, without thyrotoxic sxs: weight loss, heat intolerance, hyperdefecation, palpitations, anxiety. - she does not appear to have exogenous causes for the low TSH.  - We discussed that possible causes of thyrotoxicosis are:  Graves ds   Thyroiditis toxic multinodular goiter/ toxic adenoma (I cannot feel nodules at palpation of her thyroid). - I explained that, since she does not have any hyperthyroid symptoms, she may have a baseline of a slightly low TSH level. The TSH normal range is a bell-shaped curve, and she may be at the extreme left side of this curve. - I suggested that we check the TSH, fT3 and fT4 today - If the tests remain abnormal, we may need an uptake and scan to differentiate between the 3 above possible etiologies  - we discussed about possible modalities of treatment for the above conditions, to include methimazole use, radioactive iodine ablation or (last resort) surgery. - we might need to do thyroid ultrasound depending on the results of the uptake and scan (if a cold nodule is present) - I do not feel that we need to add beta blockers at this time, since she is not tachycardic, anxious, or tremulous - I gave her info about hyperthyroidism and associated sxs >> she will call me if she develops these and we will need to recheck her TFTs then - RTC in 1 year, but likely sooner for repeat labs  Office Visit on 09/12/2015  Component Date Value Ref Range Status  . TSH 09/12/2015 0.41  0.35 - 4.50 uIU/mL Final  . Free T4 09/12/2015 0.92  0.60 - 1.60 ng/dL Final  . T3, Free 16/09/9603 3.3  2.3 - 4.2 pg/mL Final   Labs normal >> will recheck in 1 year.

## 2015-09-13 LAB — T4, FREE: Free T4: 0.92 ng/dL (ref 0.60–1.60)

## 2015-09-13 LAB — T3, FREE: T3, Free: 3.3 pg/mL (ref 2.3–4.2)

## 2015-09-13 LAB — TSH: TSH: 0.41 u[IU]/mL (ref 0.35–4.50)

## 2016-07-19 ENCOUNTER — Ambulatory Visit (INDEPENDENT_AMBULATORY_CARE_PROVIDER_SITE_OTHER): Payer: BLUE CROSS/BLUE SHIELD | Admitting: Gynecology

## 2016-07-19 ENCOUNTER — Encounter: Payer: Self-pay | Admitting: Gynecology

## 2016-07-19 VITALS — BP 120/82 | Ht 62.0 in | Wt 147.0 lb

## 2016-07-19 DIAGNOSIS — Z01419 Encounter for gynecological examination (general) (routine) without abnormal findings: Secondary | ICD-10-CM

## 2016-07-19 DIAGNOSIS — E038 Other specified hypothyroidism: Secondary | ICD-10-CM

## 2016-07-19 DIAGNOSIS — N952 Postmenopausal atrophic vaginitis: Secondary | ICD-10-CM | POA: Diagnosis not present

## 2016-07-19 DIAGNOSIS — Z79890 Hormone replacement therapy (postmenopausal): Secondary | ICD-10-CM

## 2016-07-19 DIAGNOSIS — E039 Hypothyroidism, unspecified: Secondary | ICD-10-CM | POA: Insufficient documentation

## 2016-07-19 LAB — CBC WITH DIFFERENTIAL/PLATELET
BASOS PCT: 0 %
Basophils Absolute: 0 cells/uL (ref 0–200)
EOS ABS: 81 {cells}/uL (ref 15–500)
Eosinophils Relative: 1 %
HEMATOCRIT: 38.6 % (ref 35.0–45.0)
Hemoglobin: 12.6 g/dL (ref 11.7–15.5)
LYMPHS PCT: 22 %
Lymphs Abs: 1782 cells/uL (ref 850–3900)
MCH: 30.4 pg (ref 27.0–33.0)
MCHC: 32.6 g/dL (ref 32.0–36.0)
MCV: 93 fL (ref 80.0–100.0)
MONO ABS: 405 {cells}/uL (ref 200–950)
MPV: 8.9 fL (ref 7.5–12.5)
Monocytes Relative: 5 %
NEUTROS ABS: 5832 {cells}/uL (ref 1500–7800)
Neutrophils Relative %: 72 %
PLATELETS: 286 10*3/uL (ref 140–400)
RBC: 4.15 MIL/uL (ref 3.80–5.10)
RDW: 13.3 % (ref 11.0–15.0)
WBC: 8.1 10*3/uL (ref 3.8–10.8)

## 2016-07-19 LAB — COMPREHENSIVE METABOLIC PANEL
ALK PHOS: 60 U/L (ref 33–130)
ALT: 18 U/L (ref 6–29)
AST: 17 U/L (ref 10–35)
Albumin: 4.5 g/dL (ref 3.6–5.1)
BILIRUBIN TOTAL: 0.4 mg/dL (ref 0.2–1.2)
BUN: 15 mg/dL (ref 7–25)
CO2: 27 mmol/L (ref 20–31)
Calcium: 9.2 mg/dL (ref 8.6–10.4)
Chloride: 104 mmol/L (ref 98–110)
Creat: 0.86 mg/dL (ref 0.50–1.05)
GLUCOSE: 85 mg/dL (ref 65–99)
Potassium: 3.9 mmol/L (ref 3.5–5.3)
Sodium: 140 mmol/L (ref 135–146)
Total Protein: 6.8 g/dL (ref 6.1–8.1)

## 2016-07-19 LAB — LIPID PANEL
CHOL/HDL RATIO: 2.4 ratio (ref <=5.0)
CHOLESTEROL: 189 mg/dL (ref 125–200)
HDL: 78 mg/dL (ref >=46)
LDL CALC: 101 mg/dL (ref <130)
Triglycerides: 49 mg/dL (ref <150)
VLDL: 10 mg/dL (ref <30)

## 2016-07-19 NOTE — Progress Notes (Signed)
[name] [name] 06/16/1966 [id number]   History:    50 y.o.  for annual gyn exam with no complaints today. She has been using Vagifem 10 micrograms twice a week for vaginal atrophy. Patient with past history of total abdominal hysterectomy with bilateral salpingo-oophorectomy in the year 2000 is a result of severe endometriosis. She has been on HRT since 2013. Patient with no past history of any abnormal Pap smears. Her last bone density study was normal in 2015. She had a colonoscopy 2 years ago which was normal.  Past medical history,surgical history, family history and social history were all reviewed and documented in the EPIC chart.  Gynecologic History No LMP recorded. Patient has had a hysterectomy. Contraception: status post hysterectomy Last Pap: 2012. Results were: normal Last mammogram: 2016. Results were: Normal but dense had three-dimensional mammogram  Obstetric History OB History  Gravida Para Term Preterm AB Living  1 1 1     1   SAB TAB Ectopic Multiple Live Births          1    # Outcome Date GA Lbr Len/2nd Weight Sex Delivery Anes PTL Lv  1 Term     F CS-Unspec  N LIV       ROS: A ROS was performed and pertinent positives and negatives are included in the history.  GENERAL: No fevers or chills. HEENT: No change in vision, no earache, sore throat or sinus congestion. NECK: No pain or stiffness. CARDIOVASCULAR: No chest pain or pressure. No palpitations. PULMONARY: No shortness of breath, cough or wheeze. GASTROINTESTINAL: No abdominal pain, nausea, vomiting or diarrhea, melena or bright red blood per rectum. GENITOURINARY: No urinary frequency, urgency, hesitancy or dysuria. MUSCULOSKELETAL: No joint or muscle pain, no back pain, no recent trauma. DERMATOLOGIC: No rash, no itching, no lesions. ENDOCRINE: No polyuria, polydipsia, no heat or cold intolerance. No recent change in weight. HEMATOLOGICAL: No anemia or easy bruising or bleeding. NEUROLOGIC: No headache,  seizures, numbness, tingling or weakness. PSYCHIATRIC: No depression, no loss of interest in normal activity or change in sleep pattern.     Exam: chaperone present  BP 120/82   Ht 5\' 2"  (1.575 m)   Wt 147 lb (66.7 kg)   BMI 26.89 kg/m   Body mass index is 26.89 kg/m.  General appearance : Well developed well nourished female. No acute distress HEENT: Eyes: no retinal hemorrhage or exudates,  Neck supple, trachea midline, no carotid bruits, no thyroidmegaly Lungs: Clear to auscultation, no rhonchi or wheezes, or rib retractions  Heart: Regular rate and rhythm, no murmurs or gallops Breast:Examined in sitting and supine position were symmetrical in appearance, no palpable masses or tenderness,  no skin retraction, no nipple inversion, no nipple discharge, no skin discoloration, no axillary or supraclavicular lymphadenopathy Abdomen: no palpable masses or tenderness, no rebound or guarding Extremities: no edema or skin discoloration or tenderness  Pelvic:  Bartholin, Urethra, Skene Glands: Within normal limits             Vagina: No gross lesions or discharge  Cervix: No absent  Uterus  ., Absent  Adnexa  Without masses or tenderness  Anus and perineum  normal   Rectovaginal  normal sphincter tone without palpated masses or tenderness             Hemoccult cards provided     Assessment/Plan:  50 y.o. female for annual exam will return next week in a fasting state for the following screening blood work: Comprehensive  metabolic panel, fasting lipid profile, TSH, CBC, and urinalysis. She's also to schedule her bone density study. We discussed importance of calcium vitamin D and weightbearing exercises 3 times a week for osteoporosis prevention especially since she is of Asian descent. Pap smear not indicated. Patient also to schedule her mammogram. Prescription refill for Vagifem 10 g to apply intravaginally twice a week for vaginal atrophy was provided. Risk benefits and pros and cons  discussed.   Ok Edwards MD, 9:46 AM 07/19/2016

## 2016-07-20 LAB — URINALYSIS W MICROSCOPIC + REFLEX CULTURE
BACTERIA UA: NONE SEEN [HPF]
BILIRUBIN URINE: NEGATIVE
CRYSTALS: NONE SEEN [HPF]
Casts: NONE SEEN [LPF]
GLUCOSE, UA: NEGATIVE
Hgb urine dipstick: NEGATIVE
KETONES UR: NEGATIVE
Leukocytes, UA: NEGATIVE
Nitrite: NEGATIVE
PROTEIN: NEGATIVE
RBC / HPF: NONE SEEN RBC/HPF (ref <=2)
SPECIFIC GRAVITY, URINE: 1.016 (ref 1.001–1.035)
Squamous Epithelial / LPF: NONE SEEN [HPF] (ref <=5)
WBC UA: NONE SEEN WBC/HPF (ref <=5)
Yeast: NONE SEEN [HPF]
pH: 7 (ref 5.0–8.0)

## 2016-07-20 LAB — THYROID PANEL WITH TSH
Free Thyroxine Index: 2.2 (ref 1.4–3.8)
T3 Uptake: 32 % (ref 22–35)
T4 TOTAL: 6.9 ug/dL (ref 4.5–12.0)
TSH: 0.93 mIU/L

## 2016-08-27 ENCOUNTER — Ambulatory Visit (INDEPENDENT_AMBULATORY_CARE_PROVIDER_SITE_OTHER): Payer: BLUE CROSS/BLUE SHIELD

## 2016-08-27 ENCOUNTER — Other Ambulatory Visit: Payer: Self-pay | Admitting: Gynecology

## 2016-08-27 DIAGNOSIS — Z13820 Encounter for screening for osteoporosis: Secondary | ICD-10-CM

## 2016-08-27 DIAGNOSIS — E039 Hypothyroidism, unspecified: Secondary | ICD-10-CM

## 2016-08-27 DIAGNOSIS — Z780 Asymptomatic menopausal state: Secondary | ICD-10-CM

## 2016-08-27 DIAGNOSIS — E038 Other specified hypothyroidism: Secondary | ICD-10-CM

## 2016-08-28 ENCOUNTER — Other Ambulatory Visit: Payer: Self-pay | Admitting: *Deleted

## 2016-08-28 DIAGNOSIS — E559 Vitamin D deficiency, unspecified: Secondary | ICD-10-CM

## 2016-09-03 ENCOUNTER — Other Ambulatory Visit: Payer: BLUE CROSS/BLUE SHIELD

## 2016-09-03 DIAGNOSIS — E559 Vitamin D deficiency, unspecified: Secondary | ICD-10-CM

## 2016-09-04 ENCOUNTER — Encounter: Payer: Self-pay | Admitting: Gynecology

## 2016-09-04 ENCOUNTER — Other Ambulatory Visit: Payer: Self-pay | Admitting: Gynecology

## 2016-09-04 DIAGNOSIS — E559 Vitamin D deficiency, unspecified: Secondary | ICD-10-CM

## 2016-09-04 LAB — VITAMIN D 25 HYDROXY (VIT D DEFICIENCY, FRACTURES): VIT D 25 HYDROXY: 28 ng/mL — AB (ref 30–100)

## 2016-09-04 MED ORDER — VITAMIN D (ERGOCALCIFEROL) 1.25 MG (50000 UNIT) PO CAPS: 50000.0000 [IU] | ORAL_CAPSULE | ORAL | 0 refills | Status: DC

## 2016-09-11 ENCOUNTER — Encounter: Payer: Self-pay | Admitting: Internal Medicine

## 2016-09-11 ENCOUNTER — Ambulatory Visit (INDEPENDENT_AMBULATORY_CARE_PROVIDER_SITE_OTHER): Payer: BLUE CROSS/BLUE SHIELD | Admitting: Internal Medicine

## 2016-09-11 VITALS — BP 110/62 | HR 52 | Ht 62.5 in | Wt 146.0 lb

## 2016-09-11 DIAGNOSIS — E039 Hypothyroidism, unspecified: Secondary | ICD-10-CM

## 2016-09-11 DIAGNOSIS — E038 Other specified hypothyroidism: Secondary | ICD-10-CM

## 2016-09-11 NOTE — Patient Instructions (Signed)
Please continue to have your thyroid tests checked by Dr. Lily PeerFernandez yearly.  Return to see me as needed.

## 2016-09-11 NOTE — Progress Notes (Signed)
Patient ID: [name] [name], female   DOB: 04-28-1966, 50 y.o.   MRN: [id number]   HPI  [name] [name] is a [age] y.o.-year-old [place] female, initially referred by her PCP, Dr. Reynaldo MiniumJuan Fernandez, returning for follow-up for subclinical thyrotoxicosis. Last visit 1 year ago.  Pt has had slightly low TSH levels alternating with normal low levels for several years. Total T4 was normal. At last checks her labs were normal, so we continued to just follow her, without intervention.  Since last visit, she had a torn meniscus. Now better.   I reviewed pt's thyroid tests: Component     Latest Ref Rng & Units 07/19/2016  Thyroxine (T4)     4.5 - 12.0 ug/dL 6.9  T3 Uptake     22 - 35 % 32  Free Thyroxine Index     1.4 - 3.8 2.2  TSH     mIU/L 0.93   Lab Results  Component Value Date   TSH 0.93 07/19/2016   TSH 0.41 09/12/2015   TSH 0.341 (L) 07/21/2015   TSH 0.345 (L) 07/19/2015   TSH 0.350 06/29/2014   TSH 0.339 (L) 06/22/2014   TSH 0.548 06/24/2013   TSH 0.295 (L) 06/18/2012   FREET4 0.92 09/12/2015    Pt denies feeling nodules in neck, hoarseness, dysphagia/odynophagia, SOB with lying down; she c/o: - + Intentional weight loss - + fatigue - no heat intolerance - No tremors - No anxiety - No palpitations - No hyperdefecation - No hair loss  Pt does have a FH of thyroid ds.: 1 sister - ? Hyperthyroidism, 1 sister - thyroid nodule. No FH of thyroid cancer. No h/o radiation tx to head or neck.  No seaweed or kelp, no recent contrast studies. No steroid use. No herbal supplements. Takes Hair Skin and Nails vitamins rarely - not today.   She also has a history of surgical menopause in 2000.   ROS: Constitutional: + see HPI. Eyes: no blurry vision, no xerophthalmia ENT: no sore throat, no nodules palpated in throat, no dysphagia/odynophagia, no hoarseness Cardiovascular: no CP/SOB/palpitations/leg swelling Respiratory: no cough/SOB Gastrointestinal: no N/V/D/C Musculoskeletal: no  muscle/+ joint aches Skin: no rashes Neurological: no tremors/numbness/tingling/dizziness  I reviewed pt's medications, allergies, PMH, social hx, family hx, and changes were documented in the history of present illness. Otherwise, unchanged from my initial visit note. Started Ergocalciferol since last visit.  Past Medical History:  Diagnosis Date  . Endometriosis   . Vitamin D deficiency    Past Surgical History:  Procedure Laterality Date  . ABDOMINAL HYSTERECTOMY  2000   BSO  . CESAREAN SECTION     Social History   Social History  . Marital Status: Married    Spouse Name: N/A  . 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 Smoking status: Never Smoker   . Smokeless tobacco: Never Used  . Alcohol Use: Red wine, 1 drink 1x a week  . Drug Use: No  . Sexual Activity: Yes    Birth Control/ Protection: Surgical     Comment: HYST   Current Outpatient Prescriptions on File Prior to Visit  Medication Sig Dispense Refill  . calcium carbonate (OS-CAL) 600 MG TABS Take 600 mg by mouth 2 (two) times daily with a meal.    . Estradiol (VAGIFEM) 10 MCG TABS vaginal tablet Apply one tablet three times a week vaginaly 12 tablet 11  . Vitamin D, Ergocalciferol, (DRISDOL)  50000 units CAPS capsule Take 1 capsule (50,000 Units total) by mouth every 7 (seven) days. 12 capsule 0   No current facility-administered medications on file prior to visit.    No Known Allergies Family History  Problem Relation Age of Onset  . Cancer Mother     OVARIAN   PE: BP 110/62 (BP Location: Left Arm, Patient Position: Sitting)   Pulse (!) 52   Ht 5' 2.5" (1.588 m)   Wt 146 lb (66.2 kg)   SpO2 99%   BMI 26.28 kg/m  Wt Readings from Last 3 Encounters:  09/11/16 146 lb (66.2 kg)  07/19/16 147 lb (66.7 kg)  09/12/15 155 lb 9.6 oz (70.6 kg)   Constitutional: normal weight, in NAD Eyes: PERRLA, EOMI, no exophthalmos, no lid lag, no stare ENT: moist  mucous membranes, mild symmetric thyromegaly, no cervical lymphadenopathy Cardiovascular: RRR, No MRG Respiratory: CTA B Gastrointestinal: abdomen soft, NT, ND, BS+ Musculoskeletal: no deformities, strength intact in all 4 Skin: moist, warm, no rashes Neurological: no tremor with outstretched hands, DTR normal in all 4  ASSESSMENT: 1. Mild subclinical thyrotoxicosis  PLAN:  1. Patient with A history of slightly low TSH, without thyrotoxic sxs: weight loss, heat intolerance, hyperdefecation, palpitations, anxiety. Latest 2 TFT panels were normal. Last set of labs are obtained 2 months ago. - she does not appear to have exogenous causes for the low TSH.  - Other possible causes of thyrotoxicosis are:  Graves ds   Thyroiditis toxic multinodular goiter/ toxic adenoma (I cannot feel nodules at palpation of her thyroid). However, with TFTs normalizing, there is no need for Uptake and scan >> we can continue to check TFTs annually and intervene as necessary - she will return on a prn basis - I do not feel that we need to add beta blockers at this time, since she is not tachycardic, anxious, or tremulous - Discussed about hyperthyroidism and associated sxs >> she will call me if she develops these and we will need to recheck her TFTs then - RTC as needed  Carlus Pavlov, MD PhD [facility] Endocrinology

## 2016-10-03 ENCOUNTER — Other Ambulatory Visit: Payer: Self-pay | Admitting: Gynecology

## 2016-10-03 DIAGNOSIS — Z1231 Encounter for screening mammogram for malignant neoplasm of breast: Secondary | ICD-10-CM

## 2016-10-24 ENCOUNTER — Ambulatory Visit
Admission: RE | Admit: 2016-10-24 | Discharge: 2016-10-24 | Disposition: A | Discharge status: Home / Self Care | Payer: BLUE CROSS/BLUE SHIELD | Source: Ambulatory Visit | Attending: Gynecology | Admitting: Gynecology

## 2016-10-24 DIAGNOSIS — Z1231 Encounter for screening mammogram for malignant neoplasm of breast: Secondary | ICD-10-CM | POA: Diagnosis not present

## 2016-11-26 ENCOUNTER — Other Ambulatory Visit: Payer: Self-pay | Admitting: Gynecology

## 2016-12-05 DIAGNOSIS — M1712 Unilateral primary osteoarthritis, left knee: Secondary | ICD-10-CM | POA: Diagnosis not present

## 2016-12-05 DIAGNOSIS — G8918 Other acute postprocedural pain: Secondary | ICD-10-CM | POA: Diagnosis not present

## 2016-12-05 DIAGNOSIS — M23332 Other meniscus derangements, other medial meniscus, left knee: Secondary | ICD-10-CM | POA: Diagnosis not present

## 2016-12-05 DIAGNOSIS — S83232A Complex tear of medial meniscus, current injury, left knee, initial encounter: Secondary | ICD-10-CM | POA: Diagnosis not present

## 2016-12-12 DIAGNOSIS — M25562 Pain in left knee: Secondary | ICD-10-CM | POA: Diagnosis not present

## 2016-12-20 DIAGNOSIS — M25562 Pain in left knee: Secondary | ICD-10-CM | POA: Diagnosis not present

## 2017-02-19 DIAGNOSIS — M9902 Segmental and somatic dysfunction of thoracic region: Secondary | ICD-10-CM | POA: Diagnosis not present

## 2017-02-19 DIAGNOSIS — M7501 Adhesive capsulitis of right shoulder: Secondary | ICD-10-CM | POA: Diagnosis not present

## 2017-02-19 DIAGNOSIS — M9907 Segmental and somatic dysfunction of upper extremity: Secondary | ICD-10-CM | POA: Diagnosis not present

## 2017-02-19 DIAGNOSIS — M9901 Segmental and somatic dysfunction of cervical region: Secondary | ICD-10-CM | POA: Diagnosis not present

## 2017-04-08 DIAGNOSIS — M25512 Pain in left shoulder: Secondary | ICD-10-CM | POA: Diagnosis not present

## 2017-04-08 DIAGNOSIS — M7502 Adhesive capsulitis of left shoulder: Secondary | ICD-10-CM | POA: Diagnosis not present

## 2017-04-22 DIAGNOSIS — M25512 Pain in left shoulder: Secondary | ICD-10-CM | POA: Diagnosis not present

## 2017-04-23 ENCOUNTER — Encounter: Payer: Self-pay | Admitting: Gynecology

## 2017-04-28 DIAGNOSIS — M7502 Adhesive capsulitis of left shoulder: Secondary | ICD-10-CM | POA: Diagnosis not present

## 2017-04-28 DIAGNOSIS — S43432A Superior glenoid labrum lesion of left shoulder, initial encounter: Secondary | ICD-10-CM | POA: Diagnosis not present

## 2017-05-08 DIAGNOSIS — M7502 Adhesive capsulitis of left shoulder: Secondary | ICD-10-CM | POA: Diagnosis not present

## 2017-05-09 DIAGNOSIS — M7502 Adhesive capsulitis of left shoulder: Secondary | ICD-10-CM | POA: Diagnosis not present

## 2017-05-14 DIAGNOSIS — M7502 Adhesive capsulitis of left shoulder: Secondary | ICD-10-CM | POA: Diagnosis not present

## 2017-05-29 DIAGNOSIS — M7502 Adhesive capsulitis of left shoulder: Secondary | ICD-10-CM | POA: Diagnosis not present

## 2017-06-02 DIAGNOSIS — S43432D Superior glenoid labrum lesion of left shoulder, subsequent encounter: Secondary | ICD-10-CM | POA: Diagnosis not present

## 2017-06-02 DIAGNOSIS — M7502 Adhesive capsulitis of left shoulder: Secondary | ICD-10-CM | POA: Diagnosis not present

## 2017-06-04 DIAGNOSIS — M9902 Segmental and somatic dysfunction of thoracic region: Secondary | ICD-10-CM | POA: Diagnosis not present

## 2017-06-04 DIAGNOSIS — M7501 Adhesive capsulitis of right shoulder: Secondary | ICD-10-CM | POA: Diagnosis not present

## 2017-06-04 DIAGNOSIS — M9907 Segmental and somatic dysfunction of upper extremity: Secondary | ICD-10-CM | POA: Diagnosis not present

## 2017-06-04 DIAGNOSIS — M9901 Segmental and somatic dysfunction of cervical region: Secondary | ICD-10-CM | POA: Diagnosis not present

## 2017-06-16 DIAGNOSIS — M9907 Segmental and somatic dysfunction of upper extremity: Secondary | ICD-10-CM | POA: Diagnosis not present

## 2017-06-16 DIAGNOSIS — M9901 Segmental and somatic dysfunction of cervical region: Secondary | ICD-10-CM | POA: Diagnosis not present

## 2017-06-16 DIAGNOSIS — M9902 Segmental and somatic dysfunction of thoracic region: Secondary | ICD-10-CM | POA: Diagnosis not present

## 2017-06-16 DIAGNOSIS — M7501 Adhesive capsulitis of right shoulder: Secondary | ICD-10-CM | POA: Diagnosis not present

## 2017-06-25 DIAGNOSIS — M7502 Adhesive capsulitis of left shoulder: Secondary | ICD-10-CM | POA: Diagnosis not present

## 2017-07-21 ENCOUNTER — Encounter: Payer: Self-pay | Admitting: Obstetrics & Gynecology

## 2017-07-21 ENCOUNTER — Ambulatory Visit (INDEPENDENT_AMBULATORY_CARE_PROVIDER_SITE_OTHER): Payer: BLUE CROSS/BLUE SHIELD | Admitting: Obstetrics & Gynecology

## 2017-07-21 ENCOUNTER — Encounter: Payer: BLUE CROSS/BLUE SHIELD | Admitting: Obstetrics & Gynecology

## 2017-07-21 VITALS — BP 124/80 | Ht 62.0 in | Wt 158.0 lb

## 2017-07-21 DIAGNOSIS — Z780 Asymptomatic menopausal state: Secondary | ICD-10-CM

## 2017-07-21 DIAGNOSIS — Z1151 Encounter for screening for human papillomavirus (HPV): Secondary | ICD-10-CM | POA: Diagnosis not present

## 2017-07-21 DIAGNOSIS — Z01411 Encounter for gynecological examination (general) (routine) with abnormal findings: Secondary | ICD-10-CM

## 2017-07-21 LAB — TSH: TSH: 0.66 m[IU]/L

## 2017-07-21 LAB — LIPID PANEL
CHOL/HDL RATIO: 3 ratio (ref <5.0)
CHOLESTEROL: 215 mg/dL — AB (ref <200)
HDL: 71 mg/dL (ref >50)
LDL Cholesterol: 114 mg/dL — ABNORMAL HIGH (ref <100)
TRIGLYCERIDES: 151 mg/dL — AB (ref <150)
VLDL: 30 mg/dL (ref <30)

## 2017-07-21 LAB — COMPREHENSIVE METABOLIC PANEL
ALK PHOS: 56 U/L (ref 33–130)
ALT: 21 U/L (ref 6–29)
AST: 17 U/L (ref 10–35)
Albumin: 4.5 g/dL (ref 3.6–5.1)
BUN: 11 mg/dL (ref 7–25)
CALCIUM: 9.2 mg/dL (ref 8.6–10.4)
CHLORIDE: 105 mmol/L (ref 98–110)
CO2: 24 mmol/L (ref 20–32)
Creat: 0.9 mg/dL (ref 0.50–1.05)
Glucose, Bld: 82 mg/dL (ref 65–99)
POTASSIUM: 4 mmol/L (ref 3.5–5.3)
Sodium: 139 mmol/L (ref 135–146)
TOTAL PROTEIN: 6.9 g/dL (ref 6.1–8.1)
Total Bilirubin: 0.4 mg/dL (ref 0.2–1.2)

## 2017-07-21 LAB — CBC
HEMATOCRIT: 37.2 % (ref 35.0–45.0)
HEMOGLOBIN: 12.4 g/dL (ref 11.7–15.5)
MCH: 30.8 pg (ref 27.0–33.0)
MCHC: 33.3 g/dL (ref 32.0–36.0)
MCV: 92.3 fL (ref 80.0–100.0)
MPV: 8.6 fL (ref 7.5–12.5)
Platelets: 270 10*3/uL (ref 140–400)
RBC: 4.03 MIL/uL (ref 3.80–5.10)
RDW: 13.6 % (ref 11.0–15.0)
WBC: 5.3 10*3/uL (ref 3.8–10.8)

## 2017-07-21 MED ORDER — ESTRADIOL 10 MCG VA TABS: ORAL_TABLET | VAGINAL | 4 refills | Status: DC

## 2017-07-21 NOTE — Patient Instructions (Signed)
1. Encounter for gynecological examination with abnormal finding Gyn exam s/p TAH/BSO.  Pap/HPV done.  Breasts wnl.  Will schedule screening mammo 10/2017. - CBC - Comp Met (CMET) - TSH - Lipid panel - Vitamin D 1,25 dihydroxy  2. Menopause present Continue Vagifem twice a week, represcribed.  Controling Sxs of Atrophic Vaginitis well.  Reassured that it is low risk to continue.  [name] [name], it was a pleasure to meet you today!  I will inform you of your results as soon as available.   Preventive Kegel Exercises Kegel exercises help strengthen the muscles that support the rectum, vagina, small intestine, bladder, and uterus. Doing Kegel exercises can help:  Improve bladder and bowel control.  Improve sexual response.  Reduce problems and discomfort during pregnancy.  Kegel exercises involve squeezing your pelvic floor muscles, which are the same muscles you squeeze when you try to stop the flow of urine. The exercises can be done while sitting, standing, or lying down, but it is best to vary your position. Phase 1 exercises 1. Squeeze your pelvic floor muscles tight. You should feel a tight lift in your rectal area. If you are a female, you should also feel a tightness in your vaginal area. Keep your stomach, buttocks, and legs relaxed. 2. Hold the muscles tight for up to 10 seconds. 3. Relax your muscles. Repeat this exercise 50 times a day or as many times as told by your health care provider. Continue to do this exercise for at least 4-6 weeks or for as long as told by your health care provider. This information is not intended to replace advice given to you by your health care provider. Make sure you discuss any questions you have with your health care provider. Document Released: 11/11/2012 Document Revised: 07/20/2016 Document Reviewed: 10/15/2015 Elsevier Interactive Patient Education  [employer].

## 2017-07-21 NOTE — Progress Notes (Signed)
    [name] [name] 07-28-1966 [id number]   History:    51 y.o. G1P1  Married  RP:  Established patient presenting for annual gyn exam   HPI:  S/P TAH/BSO in her 30's for Endometriosis.  Currently only on Vagifem twice a week.  Helping with vaginal dryness for IC.  No pelvic pain.  Breasts wnl.  Mictions/BMs wnl.  Past medical history,surgical history, family history and social history were all reviewed and documented in the EPIC chart.  Gynecologic History No LMP recorded. Patient has had a hysterectomy. Contraception: status post hysterectomy Last Pap: 2012. Results were: normal Last mammogram: 10/2016. Results were: normal BD 2017 normal [place] 2 yrs ago  Obstetric History OB History  Gravida Para Term Preterm AB Living  '1 1 1     1  '$ SAB TAB Ectopic Multiple Live Births          1    # Outcome Date GA Lbr Len/2nd Weight Sex Delivery Anes PTL Lv  1 Term     F CS-Unspec  N LIV       ROS: A ROS was performed and pertinent positives and negatives are included in the history.  GENERAL: No fevers or chills. HEENT: No change in vision, no earache, sore throat or sinus congestion. NECK: No pain or stiffness. CARDIOVASCULAR: No chest pain or pressure. No palpitations. PULMONARY: No shortness of breath, cough or wheeze. GASTROINTESTINAL: No abdominal pain, nausea, vomiting or diarrhea, melena or bright red blood per rectum. GENITOURINARY: No urinary frequency, urgency, hesitancy or dysuria. MUSCULOSKELETAL: No joint or muscle pain, no back pain, no recent trauma. DERMATOLOGIC: No rash, no itching, no lesions. ENDOCRINE: No polyuria, polydipsia, no heat or cold intolerance. No recent change in weight. HEMATOLOGICAL: No anemia or easy bruising or bleeding. NEUROLOGIC: No headache, seizures, numbness, tingling or weakness. PSYCHIATRIC: No depression, no loss of interest in normal activity or change in sleep pattern.     Exam:   BP 124/80   Wt 158 lb (71.7 kg)   BMI 28.44 kg/m   Body  mass index is 28.44 kg/m.  General appearance : Well developed well nourished female. No acute distress HEENT: Eyes: no retinal hemorrhage or exudates,  Neck supple, trachea midline, no carotid bruits, no thyroidmegaly Lungs: Clear to auscultation, no rhonchi or wheezes, or rib retractions  Heart: Regular rate and rhythm, no murmurs or gallops Breast:Examined in sitting and supine position were symmetrical in appearance, no palpable masses or tenderness,  no skin retraction, no nipple inversion, no nipple discharge, no skin discoloration, no axillary or supraclavicular lymphadenopathy Abdomen: no palpable masses or tenderness, no rebound or guarding Extremities: no edema or skin discoloration or tenderness  Pelvic: Vulva: Normal  Bartholin, Urethra, Skene Glands: Within normal limits             Vagina: No gross lesions or discharge.  Pap/HPV done.  Cervix/Uterus Absent  Adnexa  Without masses or tenderness  Anus and perineum  normal   Assessment/Plan:  51 y.o. female for annual exam   1. Encounter for gynecological examination with abnormal finding Gyn exam s/p TAH/BSO.  Pap/HPV done.  Breasts wnl.  Will schedule screening mammo 10/2017. - CBC - Comp Met (CMET) - TSH - Lipid panel - Vitamin D 1,25 dihydroxy  2. Menopause present Continue Vagifem twice a week, represcribed.  Controling Sxs of Atrophic Vaginitis well.  Reassured that it is low risk to continue.  Princess Bruins MD, 8:40 AM 07/21/2017

## 2017-07-21 NOTE — Addendum Note (Signed)
Addended by: Berna SpareASTILLO, Richland Grosser A on: 07/21/2017 09:22 AM   Modules accepted: Orders

## 2017-07-24 LAB — VITAMIN D 1,25 DIHYDROXY
VITAMIN D 1, 25 (OH) TOTAL: 23 pg/mL (ref 18–72)
Vitamin D2 1, 25 (OH)2: <8 pg/mL
Vitamin D3 1, 25 (OH)2: 23 pg/mL

## 2017-07-25 ENCOUNTER — Encounter: Payer: Self-pay | Admitting: *Deleted

## 2017-07-25 LAB — PAP, TP IMAGING W/ HPV RNA, RFLX HPV TYPE 16,18/45: HPV MRNA, HIGH RISK: NOT DETECTED

## 2017-08-25 DIAGNOSIS — M7502 Adhesive capsulitis of left shoulder: Secondary | ICD-10-CM | POA: Diagnosis not present

## 2017-08-25 DIAGNOSIS — S43432D Superior glenoid labrum lesion of left shoulder, subsequent encounter: Secondary | ICD-10-CM | POA: Diagnosis not present

## 2017-09-04 DIAGNOSIS — M7502 Adhesive capsulitis of left shoulder: Secondary | ICD-10-CM | POA: Diagnosis not present

## 2017-09-05 DIAGNOSIS — M7502 Adhesive capsulitis of left shoulder: Secondary | ICD-10-CM | POA: Diagnosis not present

## 2017-09-12 DIAGNOSIS — M7502 Adhesive capsulitis of left shoulder: Secondary | ICD-10-CM | POA: Diagnosis not present

## 2017-09-19 DIAGNOSIS — M7502 Adhesive capsulitis of left shoulder: Secondary | ICD-10-CM | POA: Diagnosis not present

## 2017-09-26 DIAGNOSIS — M7502 Adhesive capsulitis of left shoulder: Secondary | ICD-10-CM | POA: Diagnosis not present

## 2017-10-03 DIAGNOSIS — M7502 Adhesive capsulitis of left shoulder: Secondary | ICD-10-CM | POA: Diagnosis not present

## 2017-10-06 ENCOUNTER — Other Ambulatory Visit: Payer: Self-pay | Admitting: Obstetrics & Gynecology

## 2017-10-06 DIAGNOSIS — Z1231 Encounter for screening mammogram for malignant neoplasm of breast: Secondary | ICD-10-CM

## 2017-10-22 DIAGNOSIS — S43432D Superior glenoid labrum lesion of left shoulder, subsequent encounter: Secondary | ICD-10-CM | POA: Diagnosis not present

## 2017-10-22 DIAGNOSIS — M7502 Adhesive capsulitis of left shoulder: Secondary | ICD-10-CM | POA: Diagnosis not present

## 2017-10-22 DIAGNOSIS — M1712 Unilateral primary osteoarthritis, left knee: Secondary | ICD-10-CM | POA: Diagnosis not present

## 2017-10-29 ENCOUNTER — Ambulatory Visit
Admission: RE | Admit: 2017-10-29 | Discharge: 2017-10-29 | Disposition: A | Discharge status: Home / Self Care | Payer: BLUE CROSS/BLUE SHIELD | Source: Ambulatory Visit | Attending: Obstetrics & Gynecology | Admitting: Obstetrics & Gynecology

## 2017-10-29 DIAGNOSIS — Z1231 Encounter for screening mammogram for malignant neoplasm of breast: Secondary | ICD-10-CM | POA: Diagnosis not present

## 2018-03-25 DIAGNOSIS — M1712 Unilateral primary osteoarthritis, left knee: Secondary | ICD-10-CM | POA: Diagnosis not present

## 2018-04-03 DIAGNOSIS — M1712 Unilateral primary osteoarthritis, left knee: Secondary | ICD-10-CM | POA: Diagnosis not present

## 2018-04-13 DIAGNOSIS — M1712 Unilateral primary osteoarthritis, left knee: Secondary | ICD-10-CM | POA: Diagnosis not present

## 2018-07-24 ENCOUNTER — Encounter: Payer: Self-pay | Admitting: Obstetrics & Gynecology

## 2018-07-24 ENCOUNTER — Ambulatory Visit: Payer: BLUE CROSS/BLUE SHIELD | Admitting: Obstetrics & Gynecology

## 2018-07-24 ENCOUNTER — Encounter: Payer: BLUE CROSS/BLUE SHIELD | Admitting: Obstetrics & Gynecology

## 2018-07-24 VITALS — BP 126/82 | Ht 62.5 in | Wt 156.0 lb

## 2018-07-24 DIAGNOSIS — Z90710 Acquired absence of both cervix and uterus: Secondary | ICD-10-CM

## 2018-07-24 DIAGNOSIS — N952 Postmenopausal atrophic vaginitis: Secondary | ICD-10-CM

## 2018-07-24 DIAGNOSIS — Z01419 Encounter for gynecological examination (general) (routine) without abnormal findings: Secondary | ICD-10-CM

## 2018-07-24 DIAGNOSIS — Z90722 Acquired absence of ovaries, bilateral: Secondary | ICD-10-CM | POA: Diagnosis not present

## 2018-07-24 DIAGNOSIS — Z9079 Acquired absence of other genital organ(s): Secondary | ICD-10-CM

## 2018-07-24 MED ORDER — ESTROGENS, CONJUGATED 0.625 MG/GM VA CREA: 0.2500 | TOPICAL_CREAM | VAGINAL | 4 refills | Status: AC

## 2018-07-24 NOTE — Progress Notes (Signed)
[name] [name] Fake 1966-03-26 [id number]   History:    52 y.o. G1P1L1 Married  RP:  Established patient presenting for annual gyn exam   HPI: S/P TAH/BSO.  Well on no systemic hormone replacement therapy.  No pelvic pain.  Experiencing some decrease in libido and dryness with intercourse.  Urine and bowel movements normal.  Breasts normal.  Body mass index 28.08.  Physically active.  Has been on a keto diet for weight loss and recent cholesterol at work was elevated.  Her fasting sugar was also high.  Will do fasting health labs here today.  Past medical history,surgical history, family history and social history were all reviewed and documented in the EPIC chart.  Gynecologic History No LMP recorded. Patient has had a hysterectomy. Contraception: status post hysterectomy Last Pap: 07/2017. Results were: Negative/HPV HR neg Last mammogram: 10/2017. Results were: Negative Bone Density: 08/2016 Normal.  Will repeat at 3 yrs Colonoscopy: 3 yrs ago  Obstetric History OB History  Gravida Para Term Preterm AB Living  '1 1 1     1  ' SAB TAB Ectopic Multiple Live Births          1    # Outcome Date GA Lbr Len/2nd Weight Sex Delivery Anes PTL Lv  1 Term     F CS-Unspec  N LIV     ROS: A ROS was performed and pertinent positives and negatives are included in the history.  GENERAL: No fevers or chills. HEENT: No change in vision, no earache, sore throat or sinus congestion. NECK: No pain or stiffness. CARDIOVASCULAR: No chest pain or pressure. No palpitations. PULMONARY: No shortness of breath, cough or wheeze. GASTROINTESTINAL: No abdominal pain, nausea, vomiting or diarrhea, melena or bright red blood per rectum. GENITOURINARY: No urinary frequency, urgency, hesitancy or dysuria. MUSCULOSKELETAL: No joint or muscle pain, no back pain, no recent trauma. DERMATOLOGIC: No rash, no itching, no lesions. ENDOCRINE: No polyuria, polydipsia, no heat or cold intolerance. No recent change in weight.  HEMATOLOGICAL: No anemia or easy bruising or bleeding. NEUROLOGIC: No headache, seizures, numbness, tingling or weakness. PSYCHIATRIC: No depression, no loss of interest in normal activity or change in sleep pattern.     Exam:   BP 126/82   Ht 5' 2.5" (1.588 m)   Wt 156 lb (70.8 kg)   BMI 28.08 kg/m   Body mass index is 28.08 kg/m.  General appearance : Well developed well nourished female. No acute distress HEENT: Eyes: no retinal hemorrhage or exudates,  Neck supple, trachea midline, no carotid bruits, no thyroidmegaly Lungs: Clear to auscultation, no rhonchi or wheezes, or rib retractions  Heart: Regular rate and rhythm, no murmurs or gallops Breast:Examined in sitting and supine position were symmetrical in appearance, no palpable masses or tenderness,  no skin retraction, no nipple inversion, no nipple discharge, no skin discoloration, no axillary or supraclavicular lymphadenopathy Abdomen: no palpable masses or tenderness, no rebound or guarding Extremities: no edema or skin discoloration or tenderness  Pelvic: Vulva: Normal             Vagina: No gross lesions or discharge  Cervix/Uterus absent  Adnexa  Without masses or tenderness  Anus: Normal   Assessment/Plan:  52 y.o. female for annual exam   1. Well female exam with routine gynecological exam Gynecologic exam status post TAH/BSO.  Breast exam normal.  Last mammogram November 2018 was negative.  Patient had a colonoscopy 3 years ago.  Body mass index 28.08.  Exercising regularly  and on a keto diet.  [place] instead to improve her cholesterol. - CBC - Comp Met (CMET) - TSH - Lipid panel - Hemoglobin A1C - VITAMIN D 25 Hydroxy (Vit-D Deficiency, Fractures)  2. S/P TAH-BSO  3. Post-menopausal atrophic vaginitis Will start on Premarin cream one quarter of an applicator twice a week.  Apply a thin layer on the vulva twice a week as needed as well.  Usage reviewed with patient.  Prescription sent  to pharmacy.  Other orders - conjugated estrogens (PREMARIN) vaginal cream; Place 0.56 Applicatorfuls vaginally 2 (two) times a week. With a thin application on vulva twice a week as needed.  Princess Bruins MD, 12:45 PM 07/24/2018

## 2018-07-24 NOTE — Patient Instructions (Addendum)
1. Well female exam with routine gynecological exam Gynecologic exam status post TAH/BSO.  Breast exam normal.  Last mammogram November 2018 was negative.  Patient had a colonoscopy 3 years ago.  Body mass index 28.08.  Exercising regularly and on a keto diet.  [place] instead to improve her cholesterol. - CBC - Comp Met (CMET) - TSH - Lipid panel - Hemoglobin A1C - VITAMIN D 25 Hydroxy (Vit-D Deficiency, Fractures)  2. S/P TAH-BSO  3. Post-menopausal atrophic vaginitis Will start on Premarin cream one quarter of an applicator twice a week.  Apply a thin layer on the vulva twice a week as needed as well.  Usage reviewed with patient.  Prescription sent to pharmacy.  Other orders - conjugated estrogens (PREMARIN) vaginal cream; Place 7.29 Applicatorfuls vaginally 2 (two) times a week. With a thin application on vulva twice a week as needed.  Poky, it was a pleasure seeing you today!  I will inform you of your results as soon as they are available.

## 2018-07-25 LAB — CBC
HCT: 39.9 % (ref 35.0–45.0)
HEMOGLOBIN: 13.6 g/dL (ref 11.7–15.5)
MCH: 30.6 pg (ref 27.0–33.0)
MCHC: 34.1 g/dL (ref 32.0–36.0)
MCV: 89.9 fL (ref 80.0–100.0)
MPV: 9 fL (ref 7.5–12.5)
PLATELETS: 312 10*3/uL (ref 140–400)
RBC: 4.44 10*6/uL (ref 3.80–5.10)
RDW: 13.1 % (ref 11.0–15.0)
WBC: 8.6 10*3/uL (ref 3.8–10.8)

## 2018-07-25 LAB — COMPREHENSIVE METABOLIC PANEL
AG Ratio: 1.8 (calc) (ref 1.0–2.5)
ALBUMIN MSPROF: 4.9 g/dL (ref 3.6–5.1)
ALT: 39 U/L — ABNORMAL HIGH (ref 6–29)
AST: 31 U/L (ref 10–35)
Alkaline phosphatase (APISO): 60 U/L (ref 33–130)
BILIRUBIN TOTAL: 0.7 mg/dL (ref 0.2–1.2)
BUN: 12 mg/dL (ref 7–25)
CALCIUM: 10 mg/dL (ref 8.6–10.4)
CO2: 24 mmol/L (ref 20–32)
Chloride: 102 mmol/L (ref 98–110)
Creat: 0.87 mg/dL (ref 0.50–1.05)
Globulin: 2.8 g/dL (calc) (ref 1.9–3.7)
Glucose, Bld: 96 mg/dL (ref 65–99)
POTASSIUM: 4.9 mmol/L (ref 3.5–5.3)
SODIUM: 140 mmol/L (ref 135–146)
TOTAL PROTEIN: 7.7 g/dL (ref 6.1–8.1)

## 2018-07-25 LAB — LIPID PANEL
CHOL/HDL RATIO: 2.7 (calc) (ref <5.0)
Cholesterol: 252 mg/dL — ABNORMAL HIGH (ref <200)
HDL: 94 mg/dL (ref >50)
LDL Cholesterol (Calc): 143 mg/dL (calc) — ABNORMAL HIGH
NON-HDL CHOLESTEROL (CALC): 158 mg/dL — AB (ref <130)
Triglycerides: 49 mg/dL (ref <150)

## 2018-07-25 LAB — VITAMIN D 25 HYDROXY (VIT D DEFICIENCY, FRACTURES): Vit D, 25-Hydroxy: 27 ng/mL — ABNORMAL LOW (ref 30–100)

## 2018-07-25 LAB — TSH: TSH: 0.55 m[IU]/L

## 2018-07-25 LAB — HEMOGLOBIN A1C
HEMOGLOBIN A1C: 5.4 %{Hb} (ref <5.7)
MEAN PLASMA GLUCOSE: 108 (calc)
eAG (mmol/L): 6 (calc)

## 2018-07-27 ENCOUNTER — Other Ambulatory Visit: Payer: Self-pay | Admitting: Obstetrics & Gynecology

## 2018-07-27 DIAGNOSIS — R7989 Other specified abnormal findings of blood chemistry: Secondary | ICD-10-CM

## 2018-07-27 DIAGNOSIS — R945 Abnormal results of liver function studies: Principal | ICD-10-CM

## 2018-11-17 DIAGNOSIS — M25562 Pain in left knee: Secondary | ICD-10-CM | POA: Diagnosis not present

## 2018-11-18 DIAGNOSIS — Z1231 Encounter for screening mammogram for malignant neoplasm of breast: Secondary | ICD-10-CM | POA: Diagnosis not present

## 2018-11-30 ENCOUNTER — Telehealth: Payer: Self-pay

## 2018-11-30 DIAGNOSIS — R928 Other abnormal and inconclusive findings on diagnostic imaging of breast: Secondary | ICD-10-CM

## 2018-11-30 NOTE — Telephone Encounter (Signed)
I spoke with patient and let her know I received the report. Dr. Mackey BirchwoodML reviewed and okayed additional imaging orders. I placed orders in system but not sure if I did correctly. I told her to go [facility] and [facility] to [facility] and if they have any problem with the orders to call me and I gave her my direct phone number.

## 2018-11-30 NOTE — Telephone Encounter (Signed)
Patient [facility].  She was called that she needs additional views. [facility] and wants to go there. [facility] needs [facility]. I called [facility] and spoke with Marylene Landngela [phone]) and they faxed report and you will find it on your desk.

## 2018-12-04 ENCOUNTER — Encounter: Payer: Self-pay | Admitting: Anesthesiology

## 2018-12-15 ENCOUNTER — Other Ambulatory Visit: Payer: Self-pay | Admitting: Obstetrics & Gynecology

## 2018-12-18 ENCOUNTER — Other Ambulatory Visit: Payer: Self-pay

## 2018-12-21 ENCOUNTER — Ambulatory Visit: Payer: Self-pay

## 2018-12-21 ENCOUNTER — Ambulatory Visit
Admission: RE | Admit: 2018-12-21 | Discharge: 2018-12-21 | Disposition: A | Discharge status: Home / Self Care | Payer: BLUE CROSS/BLUE SHIELD | Source: Ambulatory Visit | Attending: Obstetrics & Gynecology | Admitting: Obstetrics & Gynecology

## 2018-12-21 DIAGNOSIS — R928 Other abnormal and inconclusive findings on diagnostic imaging of breast: Secondary | ICD-10-CM

## 2018-12-21 DIAGNOSIS — R922 Inconclusive mammogram: Secondary | ICD-10-CM | POA: Diagnosis not present

## 2019-01-07 DIAGNOSIS — Z713 Dietary counseling and surveillance: Secondary | ICD-10-CM | POA: Diagnosis not present

## 2019-02-01 DIAGNOSIS — Z713 Dietary counseling and surveillance: Secondary | ICD-10-CM | POA: Diagnosis not present

## 2019-07-27 ENCOUNTER — Encounter: Payer: Self-pay | Admitting: Obstetrics & Gynecology

## 2019-07-27 ENCOUNTER — Ambulatory Visit: Payer: BLUE CROSS/BLUE SHIELD | Admitting: Obstetrics & Gynecology

## 2019-07-27 ENCOUNTER — Other Ambulatory Visit: Payer: Self-pay

## 2019-07-27 VITALS — BP 126/84 | Ht 62.5 in | Wt 159.0 lb

## 2019-07-27 DIAGNOSIS — R7309 Other abnormal glucose: Secondary | ICD-10-CM | POA: Diagnosis not present

## 2019-07-27 DIAGNOSIS — Z01419 Encounter for gynecological examination (general) (routine) without abnormal findings: Secondary | ICD-10-CM | POA: Diagnosis not present

## 2019-07-27 DIAGNOSIS — N952 Postmenopausal atrophic vaginitis: Secondary | ICD-10-CM

## 2019-07-27 DIAGNOSIS — Z90722 Acquired absence of ovaries, bilateral: Secondary | ICD-10-CM

## 2019-07-27 DIAGNOSIS — Z780 Asymptomatic menopausal state: Secondary | ICD-10-CM

## 2019-07-27 DIAGNOSIS — Z90710 Acquired absence of both cervix and uterus: Secondary | ICD-10-CM | POA: Diagnosis not present

## 2019-07-27 DIAGNOSIS — Z9079 Acquired absence of other genital organ(s): Secondary | ICD-10-CM

## 2019-07-27 DIAGNOSIS — E663 Overweight: Secondary | ICD-10-CM

## 2019-07-27 NOTE — Progress Notes (Signed)
[name] [name] Feb 16, 1966 [id number]   History:    53 y.o. G1P1L1 Married  RP:  Established patient presenting for annual gyn exam   HPI: S/P TAH/BSO.  Well on no systemic hormone replacement therapy.  No pelvic pain.  Experiencing some decrease in libido and dryness with intercourse.  Tried Estrogen cream last year, but didn't like it, too messy, too much trouble.  Urine and bowel movements normal.  Breasts normal.  Body mass index 28.62.  Took 3 Lbs x last year.  Physically active.  On a Keto diet now.  Will do fasting health labs here today.  Past medical history,surgical history, family history and social history were all reviewed and documented in the EPIC chart.  Gynecologic History No LMP recorded. Patient has had a hysterectomy. Contraception: status post hysterectomy Last Pap: 07/2017. Results were: Negative, HPV HR negative Last mammogram: 12/2018.  Results were: Negative Bone Density: 08/2016 Normal Colonoscopy: 4 years ago  Obstetric History OB History  Gravida Para Term Preterm AB Living  '1 1 1     1  ' SAB TAB Ectopic Multiple Live Births          1    # Outcome Date GA Lbr Len/2nd Weight Sex Delivery Anes PTL Lv  1 Term     F CS-Unspec  N LIV     ROS: A ROS was performed and pertinent positives and negatives are included in the history.  GENERAL: No fevers or chills. HEENT: No change in vision, no earache, sore throat or sinus congestion. NECK: No pain or stiffness. CARDIOVASCULAR: No chest pain or pressure. No palpitations. PULMONARY: No shortness of breath, cough or wheeze. GASTROINTESTINAL: No abdominal pain, nausea, vomiting or diarrhea, melena or bright red blood per rectum. GENITOURINARY: No urinary frequency, urgency, hesitancy or dysuria. MUSCULOSKELETAL: No joint or muscle pain, no back pain, no recent trauma. DERMATOLOGIC: No rash, no itching, no lesions. ENDOCRINE: No polyuria, polydipsia, no heat or cold intolerance. No recent change in weight. HEMATOLOGICAL:  No anemia or easy bruising or bleeding. NEUROLOGIC: No headache, seizures, numbness, tingling or weakness. PSYCHIATRIC: No depression, no loss of interest in normal activity or change in sleep pattern.     Exam:   BP 126/84   Ht 5' 2.5" (1.588 m)   Wt 159 lb (72.1 kg)   BMI 28.62 kg/m   Body mass index is 28.62 kg/m.  General appearance : Well developed well nourished female. No acute distress HEENT: Eyes: no retinal hemorrhage or exudates,  Neck supple, trachea midline, no carotid bruits, no thyroidmegaly Lungs: Clear to auscultation, no rhonchi or wheezes, or rib retractions  Heart: Regular rate and rhythm, no murmurs or gallops Breast:Examined in sitting and supine position were symmetrical in appearance, no palpable masses or tenderness,  no skin retraction, no nipple inversion, no nipple discharge, no skin discoloration, no axillary or supraclavicular lymphadenopathy Abdomen: no palpable masses or tenderness, no rebound or guarding Extremities: no edema or skin discoloration or tenderness  Pelvic: Vulva: Normal             Vagina: No gross lesions or discharge  Cervix/Uterus absent  Adnexa  Without masses or tenderness  Anus: Normal   Assessment/Plan:  53 y.o. female for annual exam   1. Well female exam with routine gynecological exam Gynecologic exam status post TAH/BSO.  Pap test was negative with negative high-risk HPV in August 2018, no indication to repeat this year.  Breast exam normal.  Screening mammogram January 2020  was negative.  Colonoscopy 4 years ago.  Health labs here today. - CBC - Comp Met (CMET) - Lipid panel - TSH - VITAMIN D 25 Hydroxy (Vit-D Deficiency, Fractures)  2. S/P TAH-BSO  3. Postmenopause Well on no hormone replacement therapy.  Recommend vitamin D supplements, calcium intake of 1200 mg daily and regular weightbearing physical activities.  4. Post-menopausal atrophic vaginitis Continued dryness and pain with intercourse.  Declines  local estrogens vaginally.  Recommend coconut oil as needed, usage reviewed.  5. Overweight (BMI 25.0-29.9) Recommend a lower calorie/carb diet such as [employer].  Aerobic physical activities 5 times a week and weightlifting every 2 days.  Princess Bruins MD, 8:13 AM 07/27/2019

## 2019-07-27 NOTE — Patient Instructions (Signed)
1. Well female exam with routine gynecological exam Gynecologic exam status post TAH/BSO.  Pap test was negative with negative high-risk HPV in August 2018, no indication to repeat this year.  Breast exam normal.  Screening mammogram January 2020 was negative.  Colonoscopy 4 years ago.  Health labs here today. - CBC - Comp Met (CMET) - Lipid panel - TSH - VITAMIN D 25 Hydroxy (Vit-D Deficiency, Fractures)  2. S/P TAH-BSO  3. Postmenopause Well on no hormone replacement therapy.  Recommend vitamin D supplements, calcium intake of 1200 mg daily and regular weightbearing physical activities.  4. Post-menopausal atrophic vaginitis Continued dryness and pain with intercourse.  Declines local estrogens vaginally.  Recommend coconut oil as needed, usage reviewed.  5. Overweight (BMI 25.0-29.9) Recommend a lower calorie/carb diet such as [place] diet.  Aerobic physical activities 5 times a week and weightlifting every 2 days.  Pokie, it was a pleasure seeing you today!  I will inform you of your results as soon as they are available. 

## 2019-07-29 LAB — COMPREHENSIVE METABOLIC PANEL
AG Ratio: 2 (calc) (ref 1.0–2.5)
ALT: 13 U/L (ref 6–29)
AST: 14 U/L (ref 10–35)
Albumin: 4.4 g/dL (ref 3.6–5.1)
Alkaline phosphatase (APISO): 51 U/L (ref 37–153)
BUN: 17 mg/dL (ref 7–25)
CO2: 25 mmol/L (ref 20–32)
Calcium: 9.2 mg/dL (ref 8.6–10.4)
Chloride: 105 mmol/L (ref 98–110)
Creat: 0.91 mg/dL (ref 0.50–1.05)
Globulin: 2.2 g/dL (calc) (ref 1.9–3.7)
Glucose, Bld: 105 mg/dL — ABNORMAL HIGH (ref 65–99)
Potassium: 4.2 mmol/L (ref 3.5–5.3)
Sodium: 137 mmol/L (ref 135–146)
Total Bilirubin: 0.3 mg/dL (ref 0.2–1.2)
Total Protein: 6.6 g/dL (ref 6.1–8.1)

## 2019-07-29 LAB — CBC
HCT: 38.2 % (ref 35.0–45.0)
Hemoglobin: 12.5 g/dL (ref 11.7–15.5)
MCH: 30.5 pg (ref 27.0–33.0)
MCHC: 32.7 g/dL (ref 32.0–36.0)
MCV: 93.2 fL (ref 80.0–100.0)
MPV: 9.2 fL (ref 7.5–12.5)
Platelets: 260 10*3/uL (ref 140–400)
RBC: 4.1 10*6/uL (ref 3.80–5.10)
RDW: 12.6 % (ref 11.0–15.0)
WBC: 6.2 10*3/uL (ref 3.8–10.8)

## 2019-07-29 LAB — TEST AUTHORIZATION

## 2019-07-29 LAB — VITAMIN D 25 HYDROXY (VIT D DEFICIENCY, FRACTURES): Vit D, 25-Hydroxy: 25 ng/mL — ABNORMAL LOW (ref 30–100)

## 2019-07-29 LAB — HEMOGLOBIN A1C
Hgb A1c MFr Bld: 5.3 % of total Hgb (ref <5.7)
Mean Plasma Glucose: 105 (calc)
eAG (mmol/L): 5.8 (calc)

## 2019-07-29 LAB — LIPID PANEL
Cholesterol: 204 mg/dL — ABNORMAL HIGH (ref <200)
HDL: 75 mg/dL (ref >=50)
LDL Cholesterol (Calc): 108 mg/dL (calc) — ABNORMAL HIGH
Non-HDL Cholesterol (Calc): 129 mg/dL (calc) (ref <130)
Total CHOL/HDL Ratio: 2.7 (calc) (ref <5.0)
Triglycerides: 117 mg/dL (ref <150)

## 2019-07-29 LAB — TSH: TSH: 0.57 mIU/L

## 2019-08-04 NOTE — Telephone Encounter (Signed)
Called patient and read her the My Chart email.

## 2020-03-16 ENCOUNTER — Ambulatory Visit: Payer: BLUE CROSS/BLUE SHIELD | Attending: Internal Medicine

## 2020-03-16 DIAGNOSIS — Z23 Encounter for immunization: Secondary | ICD-10-CM

## 2020-03-16 NOTE — Progress Notes (Signed)
   Covid-19 Vaccination Clinic  Name:  Chaeli Judy    MRN: [id number] DOB: 1966-06-16  03/16/2020  Ms. Meritt was observed post Covid-19 immunization for 15 minutes without incident. She was provided with Vaccine Information Sheet and instruction to access the V-Safe system.   Ms. Elbert was instructed to call 911 with any severe reactions post vaccine: Marland Kitchen Difficulty breathing  . Swelling of face and throat  . A fast heartbeat  . A bad rash all over body  . Dizziness and weakness   Immunizations Administered    Name Date Dose VIS Date Route   Pfizer COVID-19 Vaccine 03/16/2020  1:07 PM 0.3 mL 11/19/2019 Intramuscular   Manufacturer: [employer], [employer]   Lot: [id number]   NDC: [address]-1

## 2020-04-10 ENCOUNTER — Ambulatory Visit: Payer: Self-pay | Attending: Internal Medicine

## 2020-04-10 DIAGNOSIS — Z23 Encounter for immunization: Secondary | ICD-10-CM

## 2020-04-10 NOTE — Progress Notes (Signed)
   Covid-19 Vaccination Clinic  Name:  Arrabella Westerman    MRN: [id number] DOB: [birth year]  04/10/2020  Ms. Bihl was observed post Covid-19 immunization for 15 minutes without incident. She was provided with Vaccine Information Sheet and instruction to access the V-Safe system.   Ms. Truman was instructed to call 911 with any severe reactions post vaccine: Marland Kitchen Difficulty breathing  . Swelling of face and throat  . A fast heartbeat  . A bad rash all over body  . Dizziness and weakness   Immunizations Administered    Name Date Dose VIS Date Route   Pfizer COVID-19 Vaccine 04/10/2020 10:02 AM 0.3 mL 02/02/2019 Intramuscular   Manufacturer: [employer], [employer]   Lot: [id number]   NDC: [address]-8

## 2020-07-27 ENCOUNTER — Other Ambulatory Visit: Payer: Self-pay

## 2020-07-27 ENCOUNTER — Encounter: Payer: Self-pay | Admitting: Obstetrics & Gynecology

## 2020-07-27 ENCOUNTER — Ambulatory Visit (INDEPENDENT_AMBULATORY_CARE_PROVIDER_SITE_OTHER): Payer: BC Managed Care – PPO | Admitting: Obstetrics & Gynecology

## 2020-07-27 VITALS — BP 130/84 | Ht 62.5 in | Wt 156.0 lb

## 2020-07-27 DIAGNOSIS — Z1329 Encounter for screening for other suspected endocrine disorder: Secondary | ICD-10-CM | POA: Diagnosis not present

## 2020-07-27 DIAGNOSIS — Z90710 Acquired absence of both cervix and uterus: Secondary | ICD-10-CM | POA: Diagnosis not present

## 2020-07-27 DIAGNOSIS — E559 Vitamin D deficiency, unspecified: Secondary | ICD-10-CM | POA: Diagnosis not present

## 2020-07-27 DIAGNOSIS — Z9079 Acquired absence of other genital organ(s): Secondary | ICD-10-CM

## 2020-07-27 DIAGNOSIS — Z01419 Encounter for gynecological examination (general) (routine) without abnormal findings: Secondary | ICD-10-CM | POA: Diagnosis not present

## 2020-07-27 DIAGNOSIS — E785 Hyperlipidemia, unspecified: Secondary | ICD-10-CM | POA: Diagnosis not present

## 2020-07-27 DIAGNOSIS — Z90722 Acquired absence of ovaries, bilateral: Secondary | ICD-10-CM

## 2020-07-27 DIAGNOSIS — Z780 Asymptomatic menopausal state: Secondary | ICD-10-CM | POA: Diagnosis not present

## 2020-07-27 NOTE — Progress Notes (Signed)
[name] [name] 1966-10-10 [id number]   History:    54 y.o. G1P1L1 Married.  Daughter is 23 yo with 2 children.  RP:  Established patient presenting for annual gyn exam   HPI: S/P TAH/BSO.Well on no systemic hormone replacement therapy. No pelvic pain.  No pain with intercourse. Urine and bowel movements normal. Breasts normal. Body mass index 28.08. Physically active.  Will do fasting health labs here today.  Past medical history,surgical history, family history and social history were all reviewed and documented in the EPIC chart.  Gynecologic History No LMP recorded. Patient has had a hysterectomy.  Obstetric History OB History  Gravida Para Term Preterm AB Living  '1 1 1     1  ' SAB TAB Ectopic Multiple Live Births          1    # Outcome Date GA Lbr Len/2nd Weight Sex Delivery Anes PTL Lv  1 Term     F CS-Unspec  N LIV     ROS: A ROS was performed and pertinent positives and negatives are included in the history.  GENERAL: No fevers or chills. HEENT: No change in vision, no earache, sore throat or sinus congestion. NECK: No pain or stiffness. CARDIOVASCULAR: No chest pain or pressure. No palpitations. PULMONARY: No shortness of breath, cough or wheeze. GASTROINTESTINAL: No abdominal pain, nausea, vomiting or diarrhea, melena or bright red blood per rectum. GENITOURINARY: No urinary frequency, urgency, hesitancy or dysuria. MUSCULOSKELETAL: No joint or muscle pain, no back pain, no recent trauma. DERMATOLOGIC: No rash, no itching, no lesions. ENDOCRINE: No polyuria, polydipsia, no heat or cold intolerance. No recent change in weight. HEMATOLOGICAL: No anemia or easy bruising or bleeding. NEUROLOGIC: No headache, seizures, numbness, tingling or weakness. PSYCHIATRIC: No depression, no loss of interest in normal activity or change in sleep pattern.     Exam:   BP 130/84   Ht 5' 2.5" (1.588 m)   Wt 156 lb (70.8 kg)   BMI 28.08 kg/m   Body mass index is 28.08  kg/m.  General appearance : Well developed well nourished female. No acute distress HEENT: Eyes: no retinal hemorrhage or exudates,  Neck supple, trachea midline, no carotid bruits, no thyroidmegaly Lungs: Clear to auscultation, no rhonchi or wheezes, or rib retractions  Heart: Regular rate and rhythm, no murmurs or gallops Breast:Examined in sitting and supine position were symmetrical in appearance, no palpable masses or tenderness,  no skin retraction, no nipple inversion, no nipple discharge, no skin discoloration, no axillary or supraclavicular lymphadenopathy Abdomen: no palpable masses or tenderness, no rebound or guarding Extremities: no edema or skin discoloration or tenderness  Pelvic: Vulva: Normal             Vagina: No gross lesions or discharge  Cervix/Uterus absent  Adnexa  Without masses or tenderness  Anus: Normal   Assessment/Plan:  54 y.o. female for annual exam   1. Well female exam with routine gynecological exam Gynecologic exam status post TAH/BSO.  Last Pap test 2019 was negative, no indication to repeat a Pap test this year.  Breast exam normal.  Last screening mammogram January 2020, patient will schedule a screening mammogram now.  Fasting health labs here today.  Body mass index 28.8.  Aerobic activities 5 times a week with light weightlifting every 2 days recommended.  Lower calorie/carb diet recommended. - CBC - Comp Met (CMET) - TSH - Lipid panel - VITAMIN D 25 Hydroxy (Vit-D Deficiency, Fractures)  2. S/P TAH-BSO  3. Postmenopause Well on no hormone replacement therapy.  Bone density 2017 was normal.  Vitamin D supplements, calcium intake of 1200 to 1500 mg daily and regular weightbearing physical activity is recommended.  Princess Bruins MD, 9:10 AM 07/27/2020

## 2020-07-28 LAB — COMPREHENSIVE METABOLIC PANEL
AG Ratio: 2 (calc) (ref 1.0–2.5)
ALT: 18 U/L (ref 6–29)
AST: 20 U/L (ref 10–35)
Albumin: 4.9 g/dL (ref 3.6–5.1)
Alkaline phosphatase (APISO): 48 U/L (ref 37–153)
BUN: 15 mg/dL (ref 7–25)
CO2: 29 mmol/L (ref 20–32)
Calcium: 9.7 mg/dL (ref 8.6–10.4)
Chloride: 101 mmol/L (ref 98–110)
Creat: 0.95 mg/dL (ref 0.50–1.05)
Globulin: 2.4 g/dL (calc) (ref 1.9–3.7)
Glucose, Bld: 99 mg/dL (ref 65–99)
Potassium: 4.2 mmol/L (ref 3.5–5.3)
Sodium: 139 mmol/L (ref 135–146)
Total Bilirubin: 0.7 mg/dL (ref 0.2–1.2)
Total Protein: 7.3 g/dL (ref 6.1–8.1)

## 2020-07-28 LAB — LIPID PANEL
Cholesterol: 239 mg/dL — ABNORMAL HIGH (ref <200)
HDL: 87 mg/dL (ref >=50)
LDL Cholesterol (Calc): 134 mg/dL (calc) — ABNORMAL HIGH
Non-HDL Cholesterol (Calc): 152 mg/dL (calc) — ABNORMAL HIGH (ref <130)
Total CHOL/HDL Ratio: 2.7 (calc) (ref <5.0)
Triglycerides: 83 mg/dL (ref <150)

## 2020-07-28 LAB — CBC
HCT: 40.3 % (ref 35.0–45.0)
Hemoglobin: 13.2 g/dL (ref 11.7–15.5)
MCH: 30.3 pg (ref 27.0–33.0)
MCHC: 32.8 g/dL (ref 32.0–36.0)
MCV: 92.4 fL (ref 80.0–100.0)
MPV: 8.9 fL (ref 7.5–12.5)
Platelets: 268 10*3/uL (ref 140–400)
RBC: 4.36 10*6/uL (ref 3.80–5.10)
RDW: 12.9 % (ref 11.0–15.0)
WBC: 6.3 10*3/uL (ref 3.8–10.8)

## 2020-07-28 LAB — VITAMIN D 25 HYDROXY (VIT D DEFICIENCY, FRACTURES): Vit D, 25-Hydroxy: 23 ng/mL — ABNORMAL LOW (ref 30–100)

## 2020-07-28 LAB — TSH: TSH: 0.77 mIU/L

## 2020-08-17 ENCOUNTER — Other Ambulatory Visit: Payer: Self-pay

## 2020-08-17 DIAGNOSIS — E559 Vitamin D deficiency, unspecified: Secondary | ICD-10-CM

## 2020-08-17 MED ORDER — VITAMIN D (ERGOCALCIFEROL) 1.25 MG (50000 UNIT) PO CAPS: 50000.0000 [IU] | ORAL_CAPSULE | ORAL | 0 refills | Status: DC

## 2020-08-17 NOTE — Telephone Encounter (Signed)
Spoke with patient and read her the unread My Chart message.  Rx sent for Vit D and order placed. Lab appt scheduled for 3 mos.

## 2020-11-20 ENCOUNTER — Other Ambulatory Visit: Payer: Self-pay

## 2020-11-28 ENCOUNTER — Other Ambulatory Visit: Payer: Self-pay

## 2020-11-28 ENCOUNTER — Other Ambulatory Visit: Payer: BC Managed Care – PPO

## 2020-11-28 DIAGNOSIS — E559 Vitamin D deficiency, unspecified: Secondary | ICD-10-CM | POA: Diagnosis not present

## 2020-11-30 LAB — VITAMIN D 25 HYDROXY (VIT D DEFICIENCY, FRACTURES): Vit D, 25-Hydroxy: 38 ng/mL (ref 30–100)

## 2020-12-29 ENCOUNTER — Other Ambulatory Visit: Payer: Self-pay | Admitting: Obstetrics & Gynecology

## 2020-12-29 DIAGNOSIS — Z1231 Encounter for screening mammogram for malignant neoplasm of breast: Secondary | ICD-10-CM

## 2021-02-12 ENCOUNTER — Other Ambulatory Visit: Payer: Self-pay

## 2021-02-12 ENCOUNTER — Ambulatory Visit
Admission: RE | Admit: 2021-02-12 | Discharge: 2021-02-12 | Disposition: A | Discharge status: Home / Self Care | Payer: Self-pay | Source: Ambulatory Visit | Attending: Obstetrics & Gynecology | Admitting: Obstetrics & Gynecology

## 2021-02-12 DIAGNOSIS — Z1231 Encounter for screening mammogram for malignant neoplasm of breast: Secondary | ICD-10-CM

## 2021-04-29 DIAGNOSIS — Z20822 Contact with and (suspected) exposure to covid-19: Secondary | ICD-10-CM | POA: Diagnosis not present

## 2021-04-29 DIAGNOSIS — Z03818 Encounter for observation for suspected exposure to other biological agents ruled out: Secondary | ICD-10-CM | POA: Diagnosis not present

## 2021-07-10 DIAGNOSIS — M1711 Unilateral primary osteoarthritis, right knee: Secondary | ICD-10-CM | POA: Diagnosis not present

## 2021-07-30 ENCOUNTER — Encounter: Payer: Self-pay | Admitting: Obstetrics & Gynecology

## 2021-07-30 ENCOUNTER — Ambulatory Visit (INDEPENDENT_AMBULATORY_CARE_PROVIDER_SITE_OTHER): Payer: BC Managed Care – PPO | Admitting: Obstetrics & Gynecology

## 2021-07-30 ENCOUNTER — Other Ambulatory Visit: Payer: Self-pay

## 2021-07-30 VITALS — BP 134/80 | HR 82 | Resp 14 | Ht 63.0 in | Wt 165.0 lb

## 2021-07-30 DIAGNOSIS — Z01419 Encounter for gynecological examination (general) (routine) without abnormal findings: Secondary | ICD-10-CM

## 2021-07-30 DIAGNOSIS — Z9079 Acquired absence of other genital organ(s): Secondary | ICD-10-CM

## 2021-07-30 DIAGNOSIS — E663 Overweight: Secondary | ICD-10-CM | POA: Diagnosis not present

## 2021-07-30 DIAGNOSIS — Z780 Asymptomatic menopausal state: Secondary | ICD-10-CM | POA: Diagnosis not present

## 2021-07-30 DIAGNOSIS — Z90710 Acquired absence of both cervix and uterus: Secondary | ICD-10-CM

## 2021-07-30 DIAGNOSIS — Z90722 Acquired absence of ovaries, bilateral: Secondary | ICD-10-CM

## 2021-07-30 NOTE — Progress Notes (Signed)
[name] [name] 13-Jan-1966 [id number]   History:    55 y.o. G1P1L1 Married.  Daughter is 32 yo with 2 children.   RP:  Established patient presenting for annual gyn exam    HPI: S/P TAH/BSO.  Well on no systemic hormone replacement therapy.  No pelvic pain.  No pain with intercourse.  Urine and bowel movements normal.  Breasts normal.  Screening mammo neg in 02/2021.  Body mass index 29.23.  Physically active.  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  Colono 2015, will call to schedule this year.  BD normal in 2017, will repeat at age 64.  Mother with h/o Ovarian Ca.   Past medical history,surgical history, family history and social history were all reviewed and documented in the EPIC chart.  Gynecologic History No LMP recorded. Patient has had a hysterectomy.  Obstetric History OB History  Gravida Para Term Preterm AB Living  1 1 1     1   SAB IAB Ectopic Multiple Live Births          1    # Outcome Date GA Lbr Len/2nd Weight Sex Delivery Anes PTL Lv  1 Term     F CS-Unspec  N LIV     ROS: A ROS was performed and pertinent positives and negatives are included in the history.  GENERAL: No fevers or chills. HEENT: No change in vision, no earache, sore throat or sinus congestion. NECK: No pain or stiffness. CARDIOVASCULAR: No chest pain or pressure. No palpitations. PULMONARY: No shortness of breath, cough or wheeze. GASTROINTESTINAL: No abdominal pain, nausea, vomiting or diarrhea, melena or bright red blood per rectum. GENITOURINARY: No urinary frequency, urgency, hesitancy or dysuria. MUSCULOSKELETAL: No joint or muscle pain, no back pain, no recent trauma. DERMATOLOGIC: No rash, no itching, no lesions. ENDOCRINE: No polyuria, polydipsia, no heat or cold intolerance. No recent change in weight. HEMATOLOGICAL: No anemia or easy bruising or bleeding. NEUROLOGIC: No headache, seizures, numbness, tingling or weakness. PSYCHIATRIC: No depression, no loss of interest in normal activity or change  in sleep pattern.     Exam:   BP 134/80 (BP Location: Right Arm, Patient Position: Sitting, Cuff Size: Normal)   Pulse 82   Resp 14   Ht 5\' 3"  (1.6 m)   Wt 165 lb (74.8 kg)   BMI 29.23 kg/m   Body mass index is 29.23 kg/m.  General appearance : Well developed well nourished female. No acute distress HEENT: Eyes: no retinal hemorrhage or exudates,  Neck supple, trachea midline, no carotid bruits, no thyroidmegaly Lungs: Clear to auscultation, no rhonchi or wheezes, or rib retractions  Heart: Regular rate and rhythm, no murmurs or gallops Breast:Examined in sitting and supine position were symmetrical in appearance, no palpable masses or tenderness,  no skin retraction, no nipple inversion, no nipple discharge, no skin discoloration, no axillary or supraclavicular lymphadenopathy Abdomen: no palpable masses or tenderness, no rebound or guarding Extremities: no edema or skin discoloration or tenderness  Pelvic: Vulva: Normal             Vagina: No gross lesions or discharge  Cervix/Uterus absent  Adnexa  Without masses or tenderness  Anus: Normal   Assessment/Plan:  56 y.o. female for annual exam   1. Well female exam with routine gynecological exam Gynecologic exam status post TAH/BSO.  No indication for Pap test at this time.  Breast exam normal.  Screening mammogram was negative in March 2022.  We will schedule a colonoscopy this  year.  Health labs at work.  2. S/P TAH-BSO  3. Postmenopause Well on no hormone replacement therapy.  Bone density normal in 2017.  Vitamin D supplements, calcium intake of 1.5 g/day total and regular weightbearing physical activities recommended.  4. Overweight (BMI 25.0-29.9) Will decrease calorie intake as well as carbs.  Aerobic activities 5 times a week and light weightlifting every 2 days.  Other orders - TURMERIC PO; Take by mouth.   Genia Del MD, 8:45 AM 07/30/2021

## 2021-08-16 DIAGNOSIS — M1711 Unilateral primary osteoarthritis, right knee: Secondary | ICD-10-CM | POA: Diagnosis not present

## 2021-10-18 DIAGNOSIS — M1711 Unilateral primary osteoarthritis, right knee: Secondary | ICD-10-CM | POA: Diagnosis not present

## 2022-02-07 ENCOUNTER — Other Ambulatory Visit: Payer: Self-pay | Admitting: Obstetrics & Gynecology

## 2022-02-07 DIAGNOSIS — Z1231 Encounter for screening mammogram for malignant neoplasm of breast: Secondary | ICD-10-CM

## 2022-02-13 ENCOUNTER — Other Ambulatory Visit: Payer: Self-pay

## 2022-02-13 ENCOUNTER — Ambulatory Visit
Admission: RE | Admit: 2022-02-13 | Discharge: 2022-02-13 | Disposition: A | Discharge status: Home / Self Care | Payer: BC Managed Care – PPO | Source: Ambulatory Visit | Attending: Obstetrics & Gynecology | Admitting: Obstetrics & Gynecology

## 2022-02-13 DIAGNOSIS — Z1231 Encounter for screening mammogram for malignant neoplasm of breast: Secondary | ICD-10-CM | POA: Diagnosis not present

## 2022-02-14 DIAGNOSIS — M1711 Unilateral primary osteoarthritis, right knee: Secondary | ICD-10-CM | POA: Diagnosis not present

## 2022-02-14 DIAGNOSIS — M25562 Pain in left knee: Secondary | ICD-10-CM | POA: Diagnosis not present

## 2022-07-28 IMAGING — MG MM DIGITAL SCREENING BILAT W/ TOMO AND CAD
6 of 10 series · 6 of 30 positions shown · non-contrast
Comparison: Previous exam(s).

CLINICAL DATA: Screening.

EXAM:
DIGITAL SCREENING BILATERAL MAMMOGRAM WITH TOMOSYNTHESIS AND CAD
TECHNIQUE: Bilateral screening digital craniocaudal and mediolateral oblique
mammograms were obtained. Bilateral screening digital breast
tomosynthesis was performed. The images were evaluated with
computer-aided detection.

[L CC synth-2D (1 of 2)]
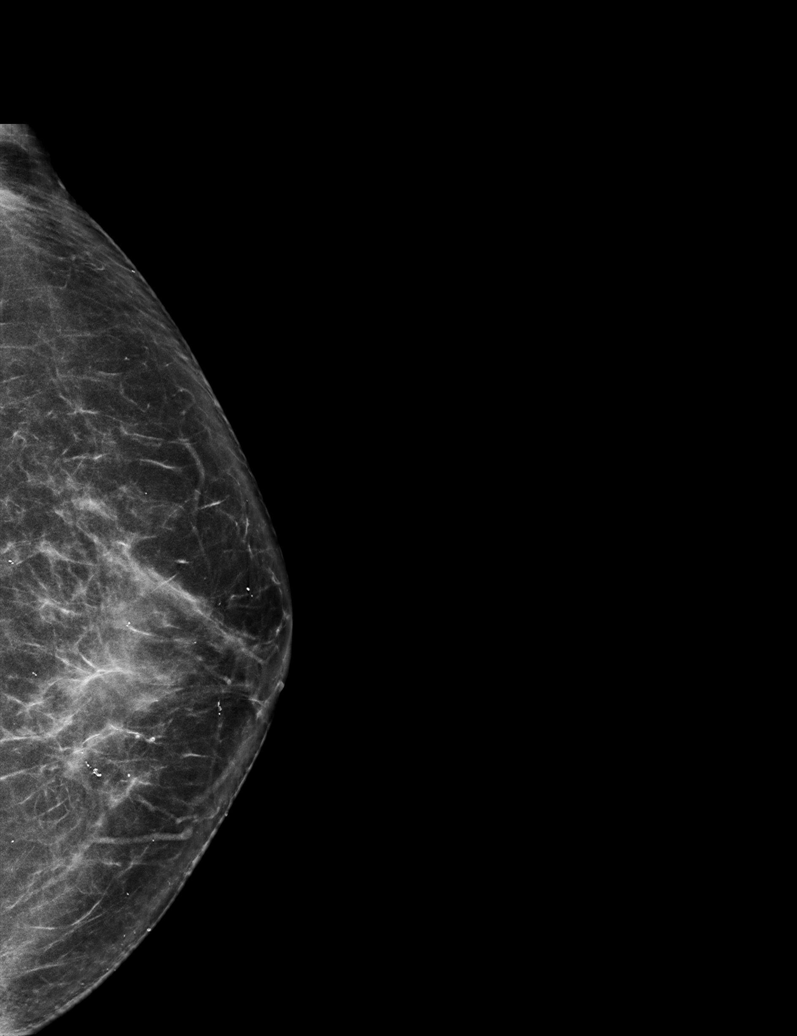

[R CC synth-2D]
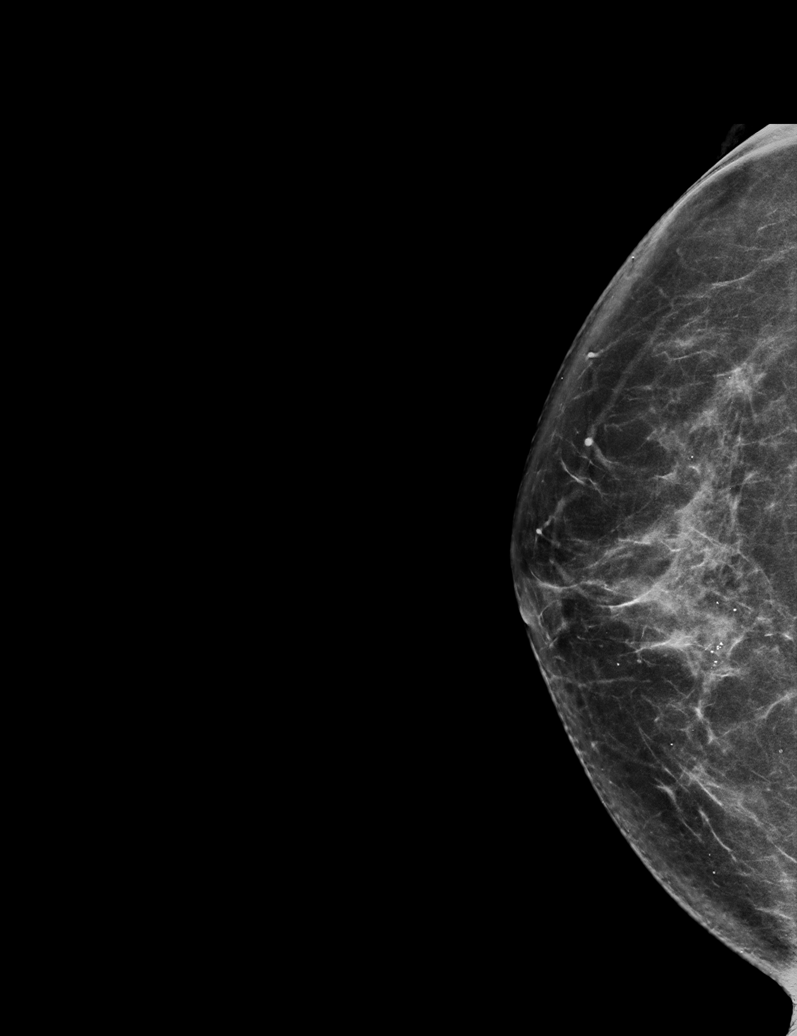

[R MLO synth-2D]
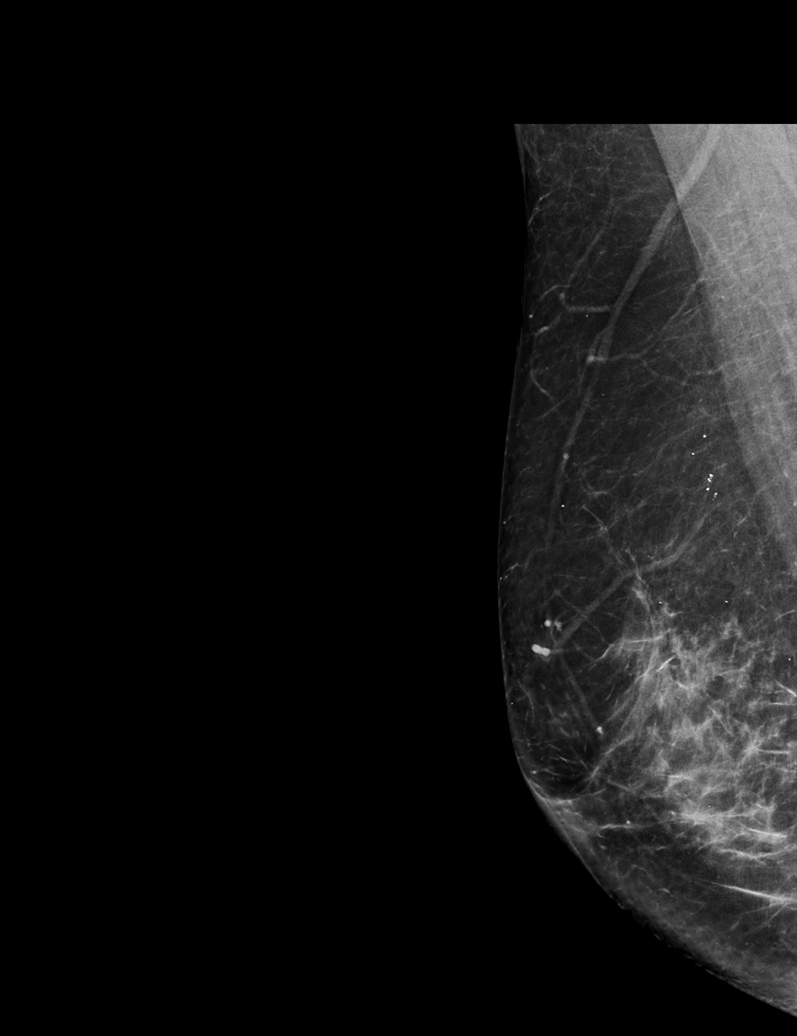

[L CC synth-2D (2 of 2)]
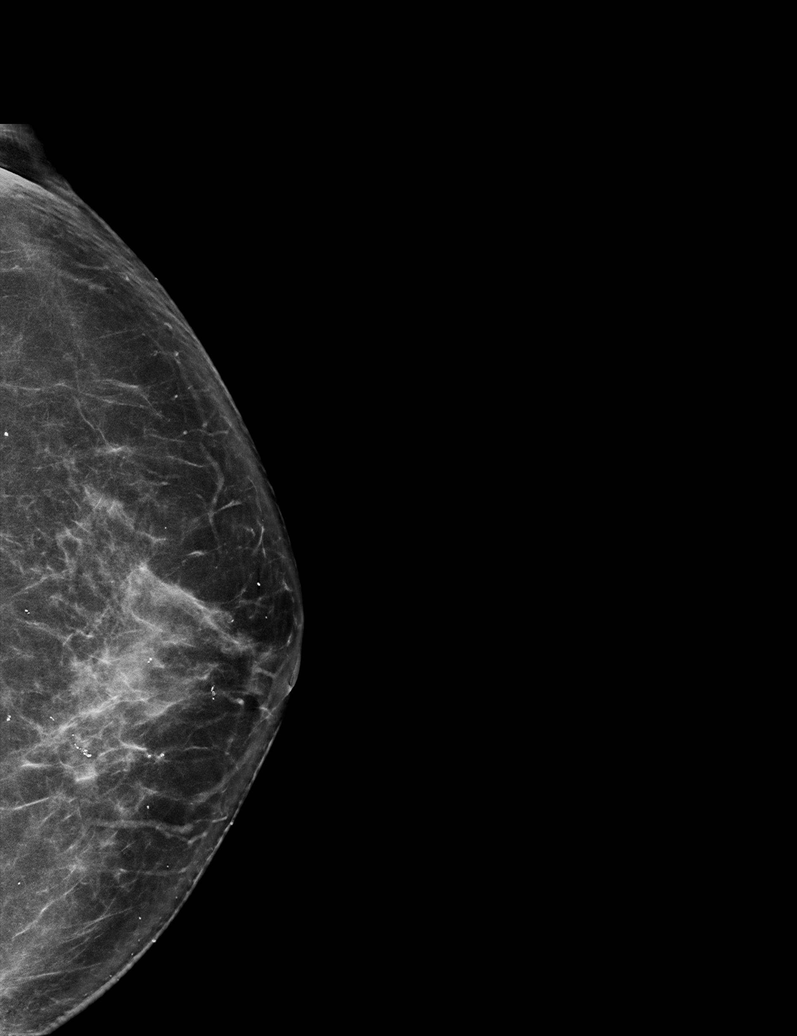

[L MLO synth-2D]
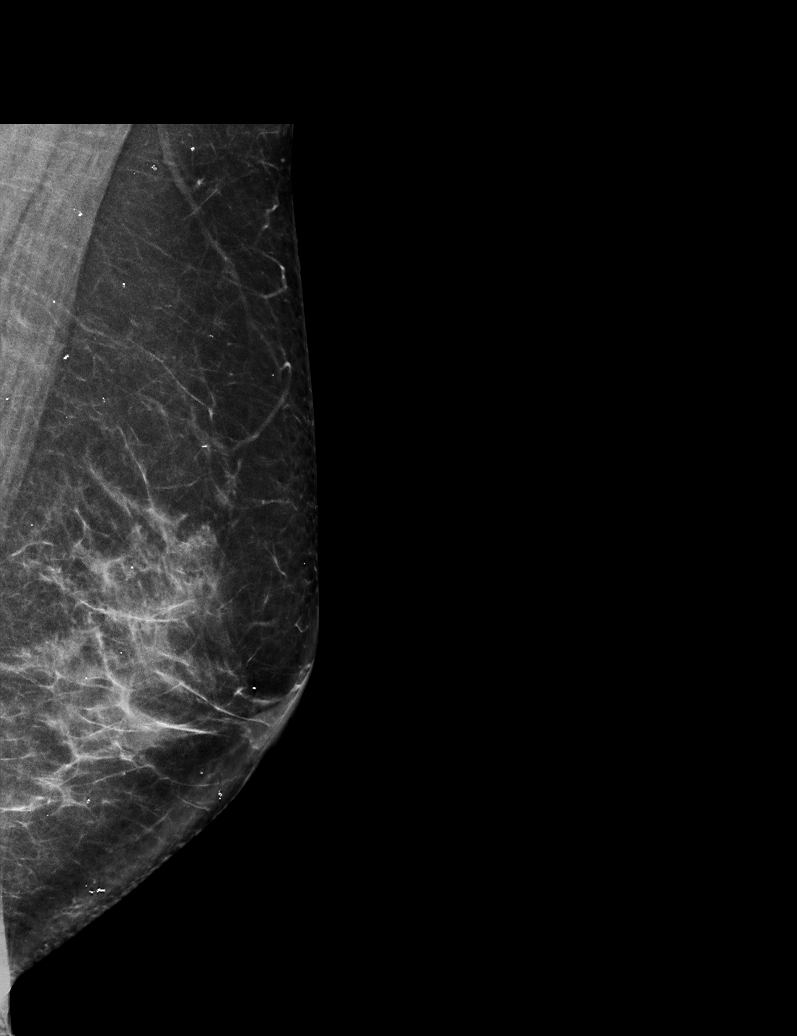

[R CC tomo · tomo slice 37/73.0]
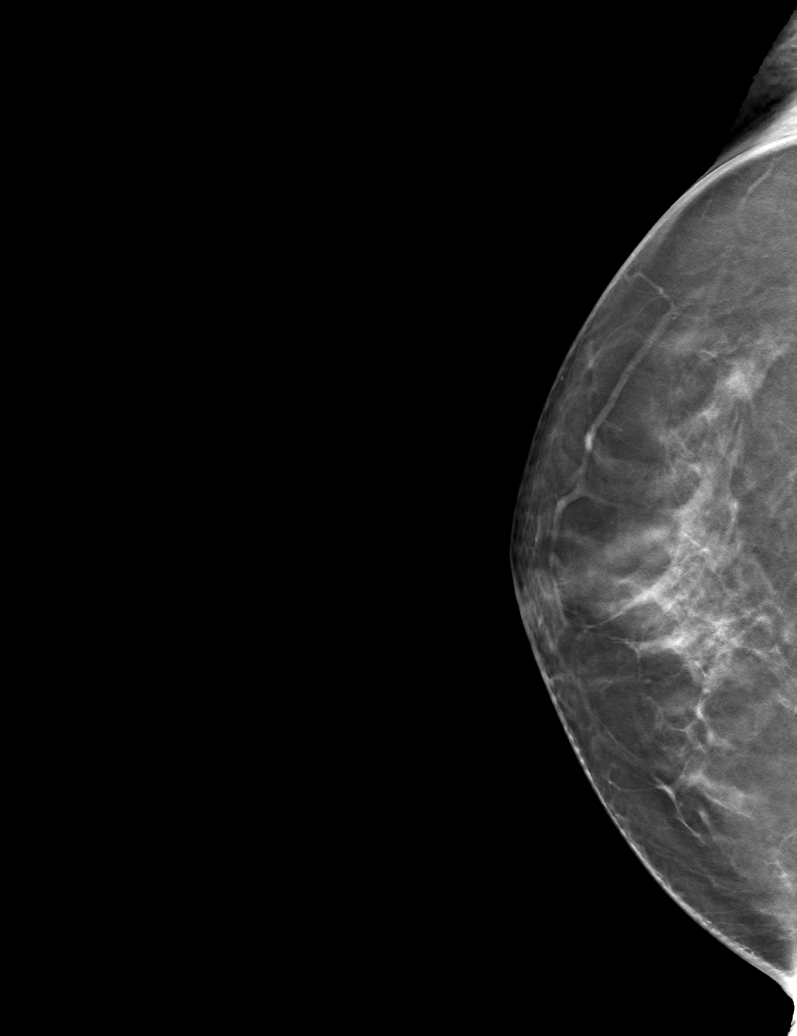

[6 of 30 positions shown; findings below may reference images not displayed]

ACR Breast Density Category b: There are scattered areas of
fibroglandular density.
FINDINGS: There are no findings suspicious for malignancy. The images were
evaluated with computer-aided detection.
IMPRESSION: No mammographic evidence of malignancy. A result letter of this
screening mammogram will be mailed directly to the patient.

RECOMMENDATION:
Screening mammogram in one year. (Code:[id number])

BI-RADS CATEGORY  1: Negative.

## 2022-07-31 ENCOUNTER — Ambulatory Visit: Payer: BC Managed Care – PPO | Admitting: Obstetrics & Gynecology

## 2022-08-19 DIAGNOSIS — M79672 Pain in left foot: Secondary | ICD-10-CM | POA: Diagnosis not present

## 2022-08-20 DIAGNOSIS — M79672 Pain in left foot: Secondary | ICD-10-CM | POA: Diagnosis not present

## 2022-08-23 DIAGNOSIS — S92325A Nondisplaced fracture of second metatarsal bone, left foot, initial encounter for closed fracture: Secondary | ICD-10-CM | POA: Diagnosis not present

## 2022-08-23 DIAGNOSIS — S92335A Nondisplaced fracture of third metatarsal bone, left foot, initial encounter for closed fracture: Secondary | ICD-10-CM | POA: Diagnosis not present

## 2022-08-23 DIAGNOSIS — S92345A Nondisplaced fracture of fourth metatarsal bone, left foot, initial encounter for closed fracture: Secondary | ICD-10-CM | POA: Diagnosis not present

## 2022-09-13 DIAGNOSIS — S92345A Nondisplaced fracture of fourth metatarsal bone, left foot, initial encounter for closed fracture: Secondary | ICD-10-CM | POA: Diagnosis not present

## 2022-09-13 DIAGNOSIS — S92335A Nondisplaced fracture of third metatarsal bone, left foot, initial encounter for closed fracture: Secondary | ICD-10-CM | POA: Diagnosis not present

## 2022-09-13 DIAGNOSIS — S92325A Nondisplaced fracture of second metatarsal bone, left foot, initial encounter for closed fracture: Secondary | ICD-10-CM | POA: Diagnosis not present

## 2022-09-27 DIAGNOSIS — S92335A Nondisplaced fracture of third metatarsal bone, left foot, initial encounter for closed fracture: Secondary | ICD-10-CM | POA: Diagnosis not present

## 2022-09-27 DIAGNOSIS — S92325A Nondisplaced fracture of second metatarsal bone, left foot, initial encounter for closed fracture: Secondary | ICD-10-CM | POA: Diagnosis not present

## 2022-09-27 DIAGNOSIS — S92345A Nondisplaced fracture of fourth metatarsal bone, left foot, initial encounter for closed fracture: Secondary | ICD-10-CM | POA: Diagnosis not present

## 2022-10-28 DIAGNOSIS — S92345A Nondisplaced fracture of fourth metatarsal bone, left foot, initial encounter for closed fracture: Secondary | ICD-10-CM | POA: Diagnosis not present

## 2022-10-28 DIAGNOSIS — S92335A Nondisplaced fracture of third metatarsal bone, left foot, initial encounter for closed fracture: Secondary | ICD-10-CM | POA: Diagnosis not present

## 2022-10-28 DIAGNOSIS — S92325A Nondisplaced fracture of second metatarsal bone, left foot, initial encounter for closed fracture: Secondary | ICD-10-CM | POA: Diagnosis not present

## 2022-11-04 DIAGNOSIS — S92345A Nondisplaced fracture of fourth metatarsal bone, left foot, initial encounter for closed fracture: Secondary | ICD-10-CM | POA: Diagnosis not present

## 2022-11-04 DIAGNOSIS — S92335A Nondisplaced fracture of third metatarsal bone, left foot, initial encounter for closed fracture: Secondary | ICD-10-CM | POA: Diagnosis not present

## 2022-11-04 DIAGNOSIS — S92325A Nondisplaced fracture of second metatarsal bone, left foot, initial encounter for closed fracture: Secondary | ICD-10-CM | POA: Diagnosis not present

## 2022-11-27 DIAGNOSIS — S92345A Nondisplaced fracture of fourth metatarsal bone, left foot, initial encounter for closed fracture: Secondary | ICD-10-CM | POA: Diagnosis not present

## 2022-11-27 DIAGNOSIS — S92325A Nondisplaced fracture of second metatarsal bone, left foot, initial encounter for closed fracture: Secondary | ICD-10-CM | POA: Diagnosis not present

## 2022-11-27 DIAGNOSIS — S92335A Nondisplaced fracture of third metatarsal bone, left foot, initial encounter for closed fracture: Secondary | ICD-10-CM | POA: Diagnosis not present

## 2023-01-13 ENCOUNTER — Other Ambulatory Visit: Payer: Self-pay | Admitting: Obstetrics & Gynecology

## 2023-01-13 DIAGNOSIS — Z1231 Encounter for screening mammogram for malignant neoplasm of breast: Secondary | ICD-10-CM

## 2023-02-26 ENCOUNTER — Ambulatory Visit
Admission: RE | Admit: 2023-02-26 | Discharge: 2023-02-26 | Disposition: A | Discharge status: Home / Self Care | Payer: BC Managed Care – PPO | Source: Ambulatory Visit | Attending: Obstetrics & Gynecology | Admitting: Obstetrics & Gynecology

## 2023-02-26 DIAGNOSIS — Z1231 Encounter for screening mammogram for malignant neoplasm of breast: Secondary | ICD-10-CM | POA: Diagnosis not present

## 2023-04-15 ENCOUNTER — Encounter: Payer: Self-pay | Admitting: Obstetrics & Gynecology

## 2023-04-15 ENCOUNTER — Other Ambulatory Visit: Payer: Self-pay

## 2023-04-15 ENCOUNTER — Ambulatory Visit (INDEPENDENT_AMBULATORY_CARE_PROVIDER_SITE_OTHER): Payer: BC Managed Care – PPO | Admitting: Obstetrics & Gynecology

## 2023-04-15 VITALS — BP 110/68 | HR 57 | Ht 62.25 in | Wt 151.0 lb

## 2023-04-15 DIAGNOSIS — Z90710 Acquired absence of both cervix and uterus: Secondary | ICD-10-CM | POA: Diagnosis not present

## 2023-04-15 DIAGNOSIS — Z8041 Family history of malignant neoplasm of ovary: Secondary | ICD-10-CM

## 2023-04-15 DIAGNOSIS — Z13820 Encounter for screening for osteoporosis: Secondary | ICD-10-CM

## 2023-04-15 DIAGNOSIS — Z780 Asymptomatic menopausal state: Secondary | ICD-10-CM | POA: Diagnosis not present

## 2023-04-15 DIAGNOSIS — Z90722 Acquired absence of ovaries, bilateral: Secondary | ICD-10-CM | POA: Diagnosis not present

## 2023-04-15 DIAGNOSIS — Z01419 Encounter for gynecological examination (general) (routine) without abnormal findings: Secondary | ICD-10-CM | POA: Diagnosis not present

## 2023-04-15 DIAGNOSIS — Z803 Family history of malignant neoplasm of breast: Secondary | ICD-10-CM

## 2023-04-15 NOTE — Progress Notes (Signed)
[name] [name] 1966-05-05 [id number]   History:    57 y.o. G1P1L1 Married.  Daughter is 16 yo with 2 children. Retiring to [place].   RP:  Established patient presenting for annual gyn exam    HPI: S/P TAH/BSO.  Well on no systemic hormone replacement therapy.  No pelvic pain.  No pain with intercourse. Pap Neg 07/2017. Pap tests always normal.  No indication for a Pap test at this time. Urine and bowel movements normal.  Breasts normal.  Screening mammo neg in 02/2023.  Body mass index 27.4.  Physically active.  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  Colono 2015, will call to schedule this year.  BD normal in 2017, will repeat now.  Mother with h/o Ovarian Ca.  Sister with Dx of Breast Ca at age 12.  Will refer to Dentist.    Past medical history,surgical history, family history and social history were all reviewed and documented in the EPIC chart.  Gynecologic History No LMP recorded. Patient has had a hysterectomy.  Obstetric History OB History  Gravida Para Term Preterm AB Living  1 1 1     1   SAB IAB Ectopic Multiple Live Births          1    # Outcome Date GA Lbr Len/2nd Weight Sex Delivery Anes PTL Lv  1 Term     F CS-Unspec  N LIV     ROS: A ROS was performed and pertinent positives and negatives are included in the history. GENERAL: No fevers or chills. HEENT: No change in vision, no earache, sore throat or sinus congestion. NECK: No pain or stiffness. CARDIOVASCULAR: No chest pain or pressure. No palpitations. PULMONARY: No shortness of breath, cough or wheeze. GASTROINTESTINAL: No abdominal pain, nausea, vomiting or diarrhea, melena or bright red blood per rectum. GENITOURINARY: No urinary frequency, urgency, hesitancy or dysuria. MUSCULOSKELETAL: No joint or muscle pain, no back pain, no recent trauma. DERMATOLOGIC: No rash, no itching, no lesions. ENDOCRINE: No polyuria, polydipsia, no heat or cold intolerance. No recent change in weight. HEMATOLOGICAL: No anemia or  easy bruising or bleeding. NEUROLOGIC: No headache, seizures, numbness, tingling or weakness. PSYCHIATRIC: No depression, no loss of interest in normal activity or change in sleep pattern.     Exam:   BP 110/68   Pulse (!) 57   Ht 5' 2.25" (1.581 m)   Wt 151 lb (68.5 kg)   SpO2 99%   BMI 27.40 kg/m   Body mass index is 27.4 kg/m.  General appearance : Well developed well nourished female. No acute distress HEENT: Eyes: no retinal hemorrhage or exudates,  Neck supple, trachea midline, no carotid bruits, no thyroidmegaly Lungs: Clear to auscultation, no rhonchi or wheezes, or rib retractions  Heart: Regular rate and rhythm, no murmurs or gallops Breast:Examined in sitting and supine position were symmetrical in appearance, no palpable masses or tenderness,  no skin retraction, no nipple inversion, no nipple discharge, no skin discoloration, no axillary or supraclavicular lymphadenopathy Abdomen: no palpable masses or tenderness, no rebound or guarding Extremities: no edema or skin discoloration or tenderness  Pelvic: Vulva: Normal             Vagina: No gross lesions or discharge  Cervix/Uterus absent  Adnexa  Without masses or tenderness  Anus: Normal   Assessment/Plan:  57 y.o. female for annual exam   1. Well female exam with routine gynecological exam S/P TAH/BSO.  Well on no systemic hormone replacement therapy.  No pelvic pain.  No pain with intercourse. Pap Neg 07/2017. Pap tests always normal.  No indication for a Pap test at this time. Urine and bowel movements normal.  Breasts normal.  Screening mammo neg in 02/2023.  Body mass index 27.4.  Physically active.  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  [place]ono 2015, will call to schedule this year.  BD normal in 2017, will repeat now.  Mother with h/o Ovarian Ca.  Sister with Dx of Breast Ca at age 59.  Will refer to Dentist.   2. S/P TAH-BSO  3. Postmenopause S/P TAH/BSO.  Well on no systemic hormone replacement  therapy. No pelvic pain.  No pain with intercourse.  - DG Bone Density; Future  4. Screening for osteoporosis BD normal in 2017, will repeat now.  - DG Bone Density; Future  5. Family history of ovarian cancer in mother Mother with h/o Ovarian Ca. Will refer to Dentist.   6. Family history of breast cancer in sister Sister with Dx of Breast Ca at age 42.  Will refer to Dentist.   Other orders - Multiple Vitamin (MULTIVITAMIN PO); Take by mouth. - VITAMIN D PO; Take by mouth.   Genia Del MD, 11:45 AM

## 2023-04-22 DIAGNOSIS — L821 Other seborrheic keratosis: Secondary | ICD-10-CM | POA: Diagnosis not present

## 2023-04-22 DIAGNOSIS — L819 Disorder of pigmentation, unspecified: Secondary | ICD-10-CM | POA: Diagnosis not present

## 2023-04-30 ENCOUNTER — Other Ambulatory Visit: Payer: BC Managed Care – PPO

## 2023-05-01 ENCOUNTER — Ambulatory Visit
Admission: RE | Admit: 2023-05-01 | Discharge: 2023-05-01 | Disposition: A | Discharge status: Home / Self Care | Payer: BC Managed Care – PPO | Source: Ambulatory Visit | Attending: Obstetrics & Gynecology | Admitting: Obstetrics & Gynecology

## 2023-05-01 DIAGNOSIS — Z90710 Acquired absence of both cervix and uterus: Secondary | ICD-10-CM | POA: Diagnosis not present

## 2023-05-01 DIAGNOSIS — Z13820 Encounter for screening for osteoporosis: Secondary | ICD-10-CM

## 2023-05-01 DIAGNOSIS — Z90722 Acquired absence of ovaries, bilateral: Secondary | ICD-10-CM | POA: Diagnosis not present

## 2023-05-01 DIAGNOSIS — E349 Endocrine disorder, unspecified: Secondary | ICD-10-CM | POA: Diagnosis not present

## 2023-05-01 DIAGNOSIS — N951 Menopausal and female climacteric states: Secondary | ICD-10-CM | POA: Diagnosis not present

## 2023-05-01 DIAGNOSIS — Z780 Asymptomatic menopausal state: Secondary | ICD-10-CM

## 2023-05-22 ENCOUNTER — Inpatient Hospital Stay: Payer: BC Managed Care – PPO | Admitting: Genetic Counselor

## 2023-05-22 ENCOUNTER — Inpatient Hospital Stay: Payer: BC Managed Care – PPO

## 2023-05-22 ENCOUNTER — Other Ambulatory Visit: Payer: Self-pay

## 2023-05-22 ENCOUNTER — Other Ambulatory Visit: Payer: Self-pay | Admitting: Genetic Counselor

## 2023-05-22 DIAGNOSIS — Z8041 Family history of malignant neoplasm of ovary: Secondary | ICD-10-CM

## 2023-05-22 DIAGNOSIS — Z803 Family history of malignant neoplasm of breast: Secondary | ICD-10-CM

## 2023-05-22 LAB — GENETIC SCREENING ORDER

## 2023-05-22 NOTE — Progress Notes (Signed)
REFERRING PROVIDER: Genia Del, MD [address] [place],  [place] [address]  PRIMARY PROVIDER:  None listed  PRIMARY REASON FOR VISIT:  Encounter Diagnoses  Name Primary?   Family history of breast cancer Yes   Family history of ovarian cancer     HISTORY OF PRESENT ILLNESS:   [name] [name], a 57 y.o. female, was seen for a [place] cancer genetics consultation at the request of Dr. Seymour Bars due to a family history of breast and ovarian cancer.  Ms. [name] [name] presents to clinic today to discuss the possibility of a hereditary predisposition to cancer, to discuss genetic testing, and to further clarify her future cancer risks, as well as potential cancer risks for family members.   Ms. [name] [name] is a 57 y.o. female with no personal history of cancer.    CANCER HISTORY:  Oncology History   No history exists.    RISK FACTORS:  Mammogram within the last year: yes; category c density  Breast bx in early 20s--benign per patient.  Hysterectomy: yes.  Ovaries intact: no; BSO in ~2000.  Menarche was at age 48 or 10.  First live birth at age 10.  OCP use for more than 10 years.  HRT use: after BSO; until mid 52s Dermatology screening: most recent last month.   Past Medical History:  Diagnosis Date   Endometriosis    Fracture, foot    left 2023   Vitamin D deficiency     Past Surgical History:  Procedure Laterality Date   ABDOMINAL HYSTERECTOMY  2000   BSO   CESAREAN SECTION       FAMILY HISTORY:  We obtained a detailed, 4-generation family history.  Significant diagnoses are listed below: Family History  Problem Relation Age of Onset   Ovarian cancer Mother 46   Breast cancer Sister 8     Ms. [name] [name] is unaware of previous family history of genetic testing for hereditary cancer risks.  Other relatives are unavailable for genetic testing at this time. There is no reported Ashkenazi Jewish ancestry. There is no known consanguinity.  GENETIC COUNSELING ASSESSMENT:  Ms. [name] [name] is a 57 y.o. female with a family history of cancer which is somewhat suggestive of a hereditary cancer syndrome and predisposition to cancer given the presence of related cancers in the family (breast, ovarian). We, therefore, discussed and recommended the following at today's visit.   DISCUSSION: We discussed that 5 - 10% of cancer is hereditary, with most cases of hereditary breast and ovarian cancer associated with mutations in BRCA1/2.  There are other genes that can be associated with hereditary breast or ovarian cancer syndromes.  We discussed that testing is beneficial for several reasons, including knowing about other cancer risks, identifying potential screening and risk-reduction options that may be appropriate, and to understanding if other family members could be at risk for cancer and allowing them to undergo genetic testing.  We reviewed the characteristics, features and inheritance patterns of hereditary cancer syndromes. We also discussed genetic testing, including the appropriate family members to test, the process of testing, insurance coverage and turn-around-time for results. We discussed the implications of a negative, positive, and variant of uncertain significant result. We discussed that negative results would be uninformative given that Ms. [name] [name] does not have a personal history of cancer. We recommended Ms. [name] [name] pursue genetic testing for a panel that contains genes associated with breast, ovarian, and other cancers.  The CancerNext-Expanded gene panel offered by [facility] and includes sequencing,  rearrangement, and RNA analysis for the following 77 genes: AIP, ALK, APC, ATM, AXIN2, BAP1, BARD1, BLM, BMPR1A, BRCA1, BRCA2, BRIP1, CDC73, CDH1, CDK4, CDKN1B, CDKN2A, CHEK2, CTNNA1, DICER1, FANCC, FH, FLCN, GALNT12, KIF1B, LZTR1, MAX, MEN1, MET, MLH1, MSH2, MSH3, MSH6, MUTYH, NBN, NF1, NF2, NTHL1, PALB2, PHOX2B, PMS2, POT1, PRKAR1A, PTCH1, PTEN, RAD51C, RAD51D, RB1, RECQL,  RET, SDHA, SDHAF2, SDHB, SDHC, SDHD, SMAD4, SMARCA4, SMARCB1, SMARCE1, STK11, SUFU, TMEM127, TP53, TSC1, TSC2, VHL and XRCC2 (sequencing and deletion/duplication); EGFR, EGLN1, HOXB13, KIT, MITF, PDGFRA, POLD1, and POLE (sequencing only); EPCAM and GREM1 (deletion/duplication only).   Based on Ms. [name] [name] family history of cancer, she meets medical criteria for genetic testing. Despite that she meets criteria, she may still have an out of pocket cost. We discussed that if her out of pocket cost for testing is over $100, the laboratory should contact them to discuss self-pay options and/or patient pay assistance programs.   We discussed the Genetic Information Non-Discrimination Act (GINA) of 2008, which helps protect individuals against genetic discrimination based on their genetic test results.  It impacts both health insurance and employment.  With health insurance, it protects against genetic test results being used for increased premiums or policy termination. For employment, it protects against hiring, firing and promoting decisions based on genetic test results.  GINA does not apply to those in the [employer], those who work for companies with less than 15 employees, and new life insurance or long-term disability insurance policies.  Health status due to a cancer diagnosis is not protected under GINA.  PLAN: After considering the risks, benefits, and limitations, Ms. [name] [name] provided informed consent to pursue genetic testing and the blood sample was sent to [facility] for analysis of the CancerNext-Expanded +RNAinsight Panel. Results should be available within approximately 3 weeks' time, at which point they will be disclosed by telephone to Ms. [name] [name], as will any additional recommendations warranted by these results. [name] will receive a summary of her genetic counseling visit and a copy of her results once available. This information will also be available in Epic.   Ms. [name] [name] questions were  answered to her satisfaction today. Our contact information was provided should additional questions or concerns arise. Thank you for the referral and allowing [place] to share in the care of your patient.   Stevon Gough M. Rennie Plowman, MS, [facility] Genetic Counselor [email or web address] (P) [phone]   The patient was seen for a total of 35 minutes in face-to-face genetic counseling.  The patient was seen alone.  Drs. Gunnar Bulla and/or Mosetta Putt were available to discuss this case as needed.  _______________________________________________________________________ For Office Staff:  Number of people involved in session: 1 Was an Intern/ student involved with case: no

## 2023-05-26 ENCOUNTER — Encounter: Payer: Self-pay | Admitting: Genetic Counselor

## 2023-05-26 DIAGNOSIS — Z8041 Family history of malignant neoplasm of ovary: Secondary | ICD-10-CM

## 2023-05-26 DIAGNOSIS — Z803 Family history of malignant neoplasm of breast: Secondary | ICD-10-CM | POA: Insufficient documentation

## 2023-05-26 HISTORY — DX: Family history of malignant neoplasm of ovary: Z80.41

## 2023-05-26 HISTORY — DX: Family history of malignant neoplasm of breast: Z80.3

## 2023-06-04 ENCOUNTER — Encounter: Payer: Self-pay | Admitting: Genetic Counselor

## 2023-06-04 ENCOUNTER — Ambulatory Visit: Payer: Self-pay | Admitting: Genetic Counselor

## 2023-06-04 ENCOUNTER — Telehealth: Payer: Self-pay | Admitting: Genetic Counselor

## 2023-06-04 DIAGNOSIS — Z1379 Encounter for other screening for genetic and chromosomal anomalies: Secondary | ICD-10-CM | POA: Insufficient documentation

## 2023-06-04 DIAGNOSIS — Z803 Family history of malignant neoplasm of breast: Secondary | ICD-10-CM

## 2023-06-04 DIAGNOSIS — Z8041 Family history of malignant neoplasm of ovary: Secondary | ICD-10-CM

## 2023-06-04 NOTE — Telephone Encounter (Signed)
Disclosed negative genetics.    

## 2023-06-04 NOTE — Telephone Encounter (Signed)
Contacted patient in attempt to disclose results of genetic testing.  LVM with contact information requesting a call back.  

## 2023-06-06 DIAGNOSIS — Z0000 Encounter for general adult medical examination without abnormal findings: Secondary | ICD-10-CM | POA: Diagnosis not present

## 2023-06-06 DIAGNOSIS — E7800 Pure hypercholesterolemia, unspecified: Secondary | ICD-10-CM | POA: Diagnosis not present

## 2023-06-06 DIAGNOSIS — E038 Other specified hypothyroidism: Secondary | ICD-10-CM | POA: Diagnosis not present

## 2023-06-06 DIAGNOSIS — E559 Vitamin D deficiency, unspecified: Secondary | ICD-10-CM | POA: Diagnosis not present

## 2023-06-06 DIAGNOSIS — Z803 Family history of malignant neoplasm of breast: Secondary | ICD-10-CM | POA: Diagnosis not present

## 2023-06-06 DIAGNOSIS — Z131 Encounter for screening for diabetes mellitus: Secondary | ICD-10-CM | POA: Diagnosis not present

## 2023-06-06 DIAGNOSIS — Z8041 Family history of malignant neoplasm of ovary: Secondary | ICD-10-CM | POA: Diagnosis not present

## 2023-06-06 DIAGNOSIS — Z1330 Encounter for screening examination for mental health and behavioral disorders, unspecified: Secondary | ICD-10-CM | POA: Diagnosis not present

## 2023-06-10 DIAGNOSIS — Z23 Encounter for immunization: Secondary | ICD-10-CM | POA: Diagnosis not present

## 2023-06-16 NOTE — Progress Notes (Signed)
HPI:   [name] [name] was previously seen in the [facility] due to a family history of breast and ovarian cancer and concerns regarding a hereditary predisposition to cancer.   [name] [name] recent genetic test results were disclosed to her by telephone. These results and recommendations are discussed in more detail below.  CANCER HISTORY:  Oncology History   No history exists.    FAMILY HISTORY:  We obtained a detailed, 4-generation family history.  Significant diagnoses are listed below:      Family History  Problem Relation Age of Onset   Ovarian cancer Mother 67   Breast cancer Sister 48       [name] [name] is unaware of previous family history of genetic testing for hereditary cancer risks.  Other relatives are unavailable for genetic testing at this time. There is no reported Ashkenazi Jewish ancestry. There is no known consanguinity.  GENETIC TEST RESULTS:  The [facility] CancerNext-Expanded +RNAinsight Panel found no pathogenic mutations.   The CancerNext-Expanded gene panel offered by [facility] and includes sequencing, rearrangement, and RNA analysis for the following 71 genes:  AIP, ALK, APC, ATM, BAP1, BARD1, BMPR1A, BRCA1, BRCA2, BRIP1, CDC73, CDH1, CDK4, CDKN1B, CDKN2A, CHEK2, DICER1, FH, FLCN, KIF1B, LZTR1,MAX, MEN1, MET, MLH1, MSH2, MSH6, MUTYH, NF1, NF2, NTHL1, PALB2, PHOX2B, PMS2, POT1, PRKAR1A, PTCH1, PTEN, RAD51C,RAD51D, RB1, RET, SDHA, SDHAF2, SDHB, SDHC, SDHD, SMAD4, SMARCA4, SMARCB1, SMARCE1, STK11, SUFU, TMEM127, TP53,TSC1, TSC2 and VHL (sequencing and deletion/duplication); AXIN2, CTNNA1, EGFR, EGLN1, HOXB13, KIT, MITF, MSH3, PDGFRA, POLD1 and POLE (sequencing only); EPCAM and GREM1 (deletion/duplication only).  .   The test report has been scanned into EPIC and is located under the Molecular Pathology section of the Results Review tab.  A portion of the result report is included below for reference. Genetic testing reported out on May 31, 2023.      Even though a pathogenic variant was not identified, possible explanations for the cancer in the family may include: There may be no hereditary risk for cancer in the family. The cancers in [name] [name] family may be sporadic/familial or due to other genetic and environmental factors. There may be a gene mutation in one of these genes that current testing methods cannot detect but that chance is small. There could be another gene that has not yet been discovered, or that we have not yet tested, that is responsible for the cancer diagnoses in the family.  It is also possible there is a hereditary cause for the cancer in the family that Ms. Bourdier did not inherit.  Therefore, it is important to remain in touch with cancer genetics in the future so that we can continue to offer [name] [name] the most up to date genetic testing.    ADDITIONAL GENETIC TESTING:  [name] [name] genetic testing was fairly extensive.  If there are additional relevant genes identified to increase cancer risk that can be analyzed in the future, we would be happy to discuss and coordinate this testing at that time.     CANCER SCREENING RECOMMENDATIONS:  [name] [name] test result is considered negative (normal).  This means that we have not identified a hereditary cause for her family history of breast or ovarian at this time.   An individual's cancer risk and medical management are not determined by genetic test results alone. Overall cancer risk assessment incorporates additional factors, including personal medical history, family history, and any available genetic information that may result in a personalized plan for cancer prevention and  surveillance. Therefore, it is recommended she continue to follow the cancer management and screening guidelines provided by her primary healthcare provider.  RECOMMENDATIONS FOR FAMILY MEMBERS:   Since she did not inherit a identifiable mutation in a cancer predisposition gene included on this  panel, her children could not have inherited a known mutation from her in one of these genes. Individuals in this family might be at some increased risk of developing cancer, over the general population risk, due to the family history of cancer.  Individuals in the family should notify their providers of the family history of cancer. We recommend women in this family have a yearly mammogram beginning at age 60, or 34 years younger than the earliest onset of cancer, an annual clinical breast exam, and perform monthly breast self-exams.  Risk models that take into account family history and hormonal history may be helpful in determining appropriate breast cancer screening options for family members.  Other members of the family may still carry a pathogenic variant in one of these genes that [name] [name] did not inherit. Based on the family history, we recommend her sister, who was diagnosed with breast cancer in her 33s, have genetic counseling and testing. [name] [name] can let [name] [name] know if we can be of any assistance in coordinating genetic counseling and/or testing for these family member.    FOLLOW-UP:  Cancer genetics is a rapidly advancing field and it is possible that new genetic tests will be appropriate for her and/or her family members in the future. We encourage [name] to remain in contact with cancer genetics, so we can update her personal and family histories and let her know of advances in cancer genetics that may benefit this family.   Our contact number was provided.  She knows she is welcome to call [name] [name] at anytime with additional questions or concerns.   Auburn Hert M. Rennie Plowman, MS, [facility] Genetic Counselor [email or web address] (P) [phone]

## 2023-06-30 DIAGNOSIS — L819 Disorder of pigmentation, unspecified: Secondary | ICD-10-CM | POA: Diagnosis not present

## 2023-07-16 DIAGNOSIS — R7989 Other specified abnormal findings of blood chemistry: Secondary | ICD-10-CM | POA: Diagnosis not present

## 2023-07-16 DIAGNOSIS — E038 Other specified hypothyroidism: Secondary | ICD-10-CM | POA: Diagnosis not present

## 2023-08-14 DIAGNOSIS — Z23 Encounter for immunization: Secondary | ICD-10-CM | POA: Diagnosis not present

## 2023-10-24 DIAGNOSIS — L819 Disorder of pigmentation, unspecified: Secondary | ICD-10-CM | POA: Diagnosis not present

## 2023-10-24 DIAGNOSIS — B353 Tinea pedis: Secondary | ICD-10-CM | POA: Diagnosis not present

## 2023-10-29 DIAGNOSIS — M1712 Unilateral primary osteoarthritis, left knee: Secondary | ICD-10-CM | POA: Diagnosis not present

## 2023-10-29 DIAGNOSIS — Z23 Encounter for immunization: Secondary | ICD-10-CM | POA: Diagnosis not present

## 2023-10-29 DIAGNOSIS — M79645 Pain in left finger(s): Secondary | ICD-10-CM | POA: Diagnosis not present
